# Patient Record
Sex: Female | Born: 1955
Health system: Southern US, Community
[De-identification: ages and names within clinical notes are randomized; demographics above are authoritative.]

## PROBLEM LIST (undated history)

## (undated) DIAGNOSIS — E669 Obesity, unspecified: Secondary | ICD-10-CM

## (undated) DIAGNOSIS — R739 Hyperglycemia, unspecified: Secondary | ICD-10-CM

## (undated) DIAGNOSIS — Z1501 Genetic susceptibility to malignant neoplasm of breast: Secondary | ICD-10-CM

## (undated) DIAGNOSIS — Z1509 Genetic susceptibility to other malignant neoplasm: Secondary | ICD-10-CM

## (undated) DIAGNOSIS — E079 Disorder of thyroid, unspecified: Secondary | ICD-10-CM

## (undated) DIAGNOSIS — I4891 Unspecified atrial fibrillation: Secondary | ICD-10-CM

## (undated) DIAGNOSIS — E785 Hyperlipidemia, unspecified: Secondary | ICD-10-CM

## (undated) DIAGNOSIS — R002 Palpitations: Secondary | ICD-10-CM

## (undated) HISTORY — PX: ABDOMINAL HYSTERECTOMY: SHX81

## (undated) HISTORY — PX: ROTATOR CUFF REPAIR: SHX139

## (undated) HISTORY — DX: Genetic susceptibility to malignant neoplasm of breast: Z15.01

## (undated) HISTORY — DX: Palpitations: R00.2

## (undated) HISTORY — DX: Unspecified atrial fibrillation: I48.91

## (undated) HISTORY — DX: Hyperglycemia, unspecified: R73.9

## (undated) HISTORY — PX: MASTECTOMY: SHX3

## (undated) HISTORY — DX: Genetic susceptibility to other malignant neoplasm: Z15.09

## (undated) HISTORY — DX: Obesity, unspecified: E66.9

## (undated) HISTORY — DX: Hyperlipidemia, unspecified: E78.5

## (undated) HISTORY — DX: Disorder of thyroid, unspecified: E07.9

---

## 1997-12-18 ENCOUNTER — Other Ambulatory Visit: Admission: RE | Admit: 1997-12-18 | Discharge: 1997-12-18 | Payer: Self-pay | Admitting: *Deleted

## 1999-02-09 ENCOUNTER — Other Ambulatory Visit: Admission: RE | Admit: 1999-02-09 | Discharge: 1999-02-09 | Payer: Self-pay | Admitting: *Deleted

## 2000-03-01 ENCOUNTER — Other Ambulatory Visit: Admission: RE | Admit: 2000-03-01 | Discharge: 2000-03-01 | Payer: Self-pay | Admitting: Obstetrics and Gynecology

## 2001-04-04 ENCOUNTER — Other Ambulatory Visit: Admission: RE | Admit: 2001-04-04 | Discharge: 2001-04-04 | Payer: Self-pay | Admitting: Obstetrics and Gynecology

## 2002-04-20 ENCOUNTER — Other Ambulatory Visit: Admission: RE | Admit: 2002-04-20 | Discharge: 2002-04-20 | Payer: Self-pay | Admitting: Obstetrics and Gynecology

## 2003-04-23 ENCOUNTER — Other Ambulatory Visit: Admission: RE | Admit: 2003-04-23 | Discharge: 2003-04-23 | Payer: Self-pay | Admitting: Obstetrics and Gynecology

## 2004-05-29 ENCOUNTER — Other Ambulatory Visit: Admission: RE | Admit: 2004-05-29 | Discharge: 2004-05-29 | Payer: Self-pay | Admitting: Obstetrics and Gynecology

## 2004-08-26 ENCOUNTER — Ambulatory Visit: Payer: Self-pay | Admitting: Internal Medicine

## 2005-09-07 ENCOUNTER — Other Ambulatory Visit: Admission: RE | Admit: 2005-09-07 | Discharge: 2005-09-07 | Payer: Self-pay | Admitting: Obstetrics & Gynecology

## 2006-07-29 ENCOUNTER — Ambulatory Visit: Payer: Self-pay | Admitting: Internal Medicine

## 2006-08-26 ENCOUNTER — Ambulatory Visit: Payer: Self-pay | Admitting: Internal Medicine

## 2006-09-16 ENCOUNTER — Ambulatory Visit: Payer: Self-pay | Admitting: Internal Medicine

## 2006-10-17 ENCOUNTER — Ambulatory Visit: Payer: Self-pay | Admitting: Internal Medicine

## 2006-10-17 LAB — CONVERTED CEMR LAB
ALT: 21 units/L (ref 0–40)
AST: 18 units/L (ref 0–37)
Albumin: 3.2 g/dL — ABNORMAL LOW (ref 3.5–5.2)
Alkaline Phosphatase: 57 units/L (ref 39–117)
Basophils Relative: 0.2 % (ref 0.0–1.0)
CO2: 27 meq/L (ref 19–32)
Calcium: 8.5 mg/dL (ref 8.4–10.5)
Creatinine, Ser: 0.6 mg/dL (ref 0.4–1.2)
GFR calc non Af Amer: 113 mL/min
Glucose, Bld: 107 mg/dL — ABNORMAL HIGH (ref 70–99)
HDL: 37 mg/dL — ABNORMAL LOW (ref >39.0)
Hgb A1c MFr Bld: 5.3 % (ref 4.6–6.0)
LDL Cholesterol: 102 mg/dL — ABNORMAL HIGH (ref 0–99)
Monocytes Absolute: 0.5 10*3/uL (ref 0.2–0.7)
Neutro Abs: 3.4 10*3/uL (ref 1.4–7.7)
Neutrophils Relative %: 56.8 % (ref 43.0–77.0)
Platelets: 185 10*3/uL (ref 150–400)
RBC: 4.05 M/uL (ref 3.87–5.11)
Sodium: 144 meq/L (ref 135–145)
Total Protein: 6.3 g/dL (ref 6.0–8.3)

## 2006-10-24 ENCOUNTER — Ambulatory Visit: Payer: Self-pay | Admitting: Internal Medicine

## 2006-11-24 ENCOUNTER — Ambulatory Visit: Payer: Self-pay | Admitting: Internal Medicine

## 2006-12-27 ENCOUNTER — Ambulatory Visit: Payer: Self-pay | Admitting: Internal Medicine

## 2007-03-24 ENCOUNTER — Other Ambulatory Visit: Admission: RE | Admit: 2007-03-24 | Discharge: 2007-03-24 | Payer: Self-pay | Admitting: Obstetrics and Gynecology

## 2007-03-24 DIAGNOSIS — E785 Hyperlipidemia, unspecified: Secondary | ICD-10-CM | POA: Insufficient documentation

## 2007-05-04 ENCOUNTER — Telehealth: Payer: Self-pay | Admitting: Internal Medicine

## 2007-05-09 ENCOUNTER — Ambulatory Visit: Payer: Self-pay | Admitting: Internal Medicine

## 2007-05-19 ENCOUNTER — Telehealth: Payer: Self-pay | Admitting: Internal Medicine

## 2007-05-19 LAB — CONVERTED CEMR LAB
Cholesterol: 197 mg/dL (ref 0–200)
HDL: 40.9 mg/dL (ref >39.0)
Hgb A1c MFr Bld: 5.5 % (ref 4.6–6.0)
LDL Cholesterol: 146 mg/dL — ABNORMAL HIGH (ref 0–99)

## 2007-06-02 ENCOUNTER — Ambulatory Visit: Payer: Self-pay | Admitting: Internal Medicine

## 2007-06-02 DIAGNOSIS — M25519 Pain in unspecified shoulder: Secondary | ICD-10-CM | POA: Insufficient documentation

## 2007-06-02 DIAGNOSIS — R7309 Other abnormal glucose: Secondary | ICD-10-CM | POA: Insufficient documentation

## 2007-06-13 ENCOUNTER — Telehealth: Payer: Self-pay | Admitting: *Deleted

## 2007-06-15 ENCOUNTER — Telehealth: Payer: Self-pay | Admitting: *Deleted

## 2007-07-20 ENCOUNTER — Telehealth: Payer: Self-pay | Admitting: Internal Medicine

## 2007-08-17 ENCOUNTER — Telehealth: Payer: Self-pay | Admitting: Internal Medicine

## 2007-08-22 ENCOUNTER — Telehealth: Payer: Self-pay | Admitting: Internal Medicine

## 2007-08-29 ENCOUNTER — Ambulatory Visit: Payer: Self-pay | Admitting: Internal Medicine

## 2007-09-08 ENCOUNTER — Encounter: Payer: Self-pay | Admitting: Internal Medicine

## 2007-09-20 ENCOUNTER — Ambulatory Visit: Payer: Self-pay | Admitting: Internal Medicine

## 2007-09-26 ENCOUNTER — Encounter: Payer: Self-pay | Admitting: Internal Medicine

## 2007-12-16 ENCOUNTER — Encounter: Payer: Self-pay | Admitting: Internal Medicine

## 2008-01-08 LAB — HM COLONOSCOPY

## 2008-03-11 ENCOUNTER — Encounter: Payer: Self-pay | Admitting: Internal Medicine

## 2008-03-25 ENCOUNTER — Other Ambulatory Visit: Admission: RE | Admit: 2008-03-25 | Discharge: 2008-03-25 | Payer: Self-pay | Admitting: Obstetrics and Gynecology

## 2008-03-25 HISTORY — PX: CERVICAL POLYPECTOMY: SHX88

## 2008-05-07 ENCOUNTER — Encounter: Payer: Self-pay | Admitting: Obstetrics and Gynecology

## 2008-05-07 ENCOUNTER — Ambulatory Visit (HOSPITAL_COMMUNITY): Admission: RE | Admit: 2008-05-07 | Discharge: 2008-05-08 | Payer: Self-pay | Admitting: Obstetrics and Gynecology

## 2008-05-07 ENCOUNTER — Encounter: Payer: Self-pay | Admitting: Internal Medicine

## 2008-10-19 ENCOUNTER — Encounter: Payer: Self-pay | Admitting: Internal Medicine

## 2008-10-21 ENCOUNTER — Telehealth: Payer: Self-pay | Admitting: Internal Medicine

## 2008-10-30 ENCOUNTER — Encounter: Payer: Self-pay | Admitting: *Deleted

## 2008-11-03 ENCOUNTER — Encounter: Admission: RE | Admit: 2008-11-03 | Discharge: 2008-11-03 | Payer: Self-pay | Admitting: Obstetrics and Gynecology

## 2008-11-03 ENCOUNTER — Encounter: Payer: Self-pay | Admitting: Internal Medicine

## 2009-02-25 ENCOUNTER — Ambulatory Visit: Payer: Self-pay | Admitting: Internal Medicine

## 2009-02-25 LAB — CONVERTED CEMR LAB
ALT: 16 units/L (ref 0–35)
AST: 17 units/L (ref 0–37)
CO2: 27 meq/L (ref 19–32)
Calcium: 9 mg/dL (ref 8.4–10.5)
Chloride: 108 meq/L (ref 96–112)
Cholesterol: 208 mg/dL — ABNORMAL HIGH (ref 0–200)
GFR calc non Af Amer: 93.28 mL/min (ref >60)
Glucose, Urine, Semiquant: NEGATIVE
HCT: 37.5 % (ref 36.0–46.0)
Hemoglobin: 12.6 g/dL (ref 12.0–15.0)
Lymphocytes Relative: 23.5 % (ref 12.0–46.0)
Lymphs Abs: 1.2 10*3/uL (ref 0.7–4.0)
MCHC: 33.6 g/dL (ref 30.0–36.0)
MCV: 91.4 fL (ref 78.0–100.0)
Platelets: 183 10*3/uL (ref 150.0–400.0)
RBC: 4.1 M/uL (ref 3.87–5.11)
Sodium: 139 meq/L (ref 135–145)
TSH: 0.78 microintl units/mL (ref 0.35–5.50)
Total CHOL/HDL Ratio: 5
Total Protein: 6.6 g/dL (ref 6.0–8.3)
Triglycerides: 130 mg/dL (ref 0.0–149.0)
WBC: 5.2 10*3/uL (ref 4.5–10.5)

## 2009-03-04 ENCOUNTER — Ambulatory Visit: Payer: Self-pay | Admitting: Internal Medicine

## 2009-06-10 ENCOUNTER — Telehealth: Payer: Self-pay | Admitting: *Deleted

## 2009-10-07 HISTORY — PX: BREAST SURGERY: SHX581

## 2009-10-07 LAB — HM MAMMOGRAPHY

## 2009-11-01 ENCOUNTER — Encounter: Payer: Self-pay | Admitting: Internal Medicine

## 2009-11-04 ENCOUNTER — Encounter: Payer: Self-pay | Admitting: Internal Medicine

## 2009-12-14 ENCOUNTER — Encounter: Admission: RE | Admit: 2009-12-14 | Discharge: 2009-12-14 | Payer: Self-pay | Admitting: Obstetrics and Gynecology

## 2010-01-21 ENCOUNTER — Encounter: Payer: Self-pay | Admitting: Internal Medicine

## 2010-03-05 ENCOUNTER — Encounter: Payer: Self-pay | Admitting: Internal Medicine

## 2010-03-10 ENCOUNTER — Encounter (INDEPENDENT_AMBULATORY_CARE_PROVIDER_SITE_OTHER): Payer: Self-pay | Admitting: General Surgery

## 2010-03-10 ENCOUNTER — Ambulatory Visit (HOSPITAL_COMMUNITY): Admission: RE | Admit: 2010-03-10 | Discharge: 2010-03-11 | Payer: Self-pay | Admitting: General Surgery

## 2010-03-20 ENCOUNTER — Encounter: Payer: Self-pay | Admitting: Internal Medicine

## 2010-03-23 ENCOUNTER — Telehealth: Payer: Self-pay | Admitting: *Deleted

## 2010-04-03 ENCOUNTER — Encounter: Payer: Self-pay | Admitting: Internal Medicine

## 2010-05-27 ENCOUNTER — Ambulatory Visit: Payer: Self-pay | Admitting: Internal Medicine

## 2010-05-27 LAB — CONVERTED CEMR LAB
ALT: 19 units/L (ref 0–35)
AST: 16 units/L (ref 0–37)
Basophils Absolute: 0 10*3/uL (ref 0.0–0.1)
Blood in Urine, dipstick: NEGATIVE
Cholesterol: 195 mg/dL (ref 0–200)
Eosinophils Relative: 2.7 % (ref 0.0–5.0)
Glucose, Bld: 104 mg/dL — ABNORMAL HIGH (ref 70–99)
Glucose, Urine, Semiquant: NEGATIVE
Lymphocytes Relative: 22.4 % (ref 12.0–46.0)
Lymphs Abs: 1.1 10*3/uL (ref 0.7–4.0)
MCHC: 33.4 g/dL (ref 30.0–36.0)
Monocytes Absolute: 0.6 10*3/uL (ref 0.1–1.0)
Monocytes Relative: 12.4 % — ABNORMAL HIGH (ref 3.0–12.0)
Neutrophils Relative %: 61.9 % (ref 43.0–77.0)
Nitrite: NEGATIVE
Potassium: 4.3 meq/L (ref 3.5–5.1)
RBC: 4.09 M/uL (ref 3.87–5.11)
RDW: 15.4 % — ABNORMAL HIGH (ref 11.5–14.6)
Specific Gravity, Urine: 1.01
TSH: 0.91 microintl units/mL (ref 0.35–5.50)
Total Bilirubin: 0.4 mg/dL (ref 0.3–1.2)
Total Protein: 6 g/dL (ref 6.0–8.3)
Urobilinogen, UA: 0.2
VLDL: 12.2 mg/dL (ref 0.0–40.0)
WBC Urine, dipstick: NEGATIVE
pH: 5

## 2010-06-03 ENCOUNTER — Ambulatory Visit: Payer: Self-pay | Admitting: Internal Medicine

## 2010-06-03 DIAGNOSIS — E669 Obesity, unspecified: Secondary | ICD-10-CM | POA: Insufficient documentation

## 2010-06-03 DIAGNOSIS — Z9010 Acquired absence of unspecified breast and nipple: Secondary | ICD-10-CM | POA: Insufficient documentation

## 2010-06-08 ENCOUNTER — Telehealth: Payer: Self-pay | Admitting: *Deleted

## 2010-06-08 ENCOUNTER — Encounter: Payer: Self-pay | Admitting: *Deleted

## 2010-06-25 ENCOUNTER — Ambulatory Visit: Payer: Self-pay | Admitting: Internal Medicine

## 2010-08-11 ENCOUNTER — Telehealth: Payer: Self-pay | Admitting: Internal Medicine

## 2010-09-08 NOTE — Letter (Signed)
Summary: [facility]   Imported By: Maryln Gottron 04/21/2010 12:11:33  _____________________________________________________________________  External Attachment:    Type:   Image     Comment:   External Document

## 2010-09-08 NOTE — Assessment & Plan Note (Signed)
Summary: Hep B#2/ssc=-add hep a//ccm  Nurse Visit   Allergies: No Known Drug Allergies  Immunizations Administered:  TwinRix # 1:    Vaccine Type: TwinRix    Site: right deltoid    Mfr: GlaxoSmithKline    Dose: 1.0 ml    Route: IM    Given by: Romualdo Bolk, CMA (AAMA)    Exp. Date: 02/08/2011    Lot #: [id number]  Orders Added: 1)  TwinRix 1ml ( Hep A&B Adult dose) [90636] 2)  Admin 1st Vaccine [90471]

## 2010-09-08 NOTE — Letter (Signed)
Summary: [facility]   Imported By: Maryln Gottron 02/05/2010 14:03:20  _____________________________________________________________________  External Attachment:    Type:   Image     Comment:   External Document

## 2010-09-08 NOTE — Letter (Signed)
Summary: [facility]   Imported By: Maryln Gottron 06/11/2010 08:35:32  _____________________________________________________________________  External Attachment:    Type:   Image     Comment:   External Document

## 2010-09-08 NOTE — Assessment & Plan Note (Signed)
Summary: CPX/CJR   Vital Signs:  Patient profile:   55 year old female Menstrual status:  hysterectomy Height:      66.5 inches Weight:      215 pounds BMI:     34.31 Pulse rate:   66 / minute BP sitting:   110 / 60  (left arm) Cuff size:   large  Vitals Entered By: Romualdo Bolk, CMA (AAMA) (June 03, 2010 11:44 AM)  Nutrition Counseling: Patient's BMI is greater than 25 and therefore counseled on weight management options. CC: CPX   History of Present Illness: Maria Glass comes in today  for preventive visit .  since last visit  she decided to undergo bilateral mastectomy per Dr Johna Sheriff . This was done without complication. She has no other new medical problem or illness of significance. She does want me to check her ears as they are a bit clogged. her other medical issues are related to her joints. She has a meniscal tear and has had a shoulder pain treated with a steroid injection.  Preventive Care Screening  Mammogram:    Date:  10/07/2009    Results:  normal   Prior Values:    Mammogram:  normal (10/07/2008)    Colonoscopy:  normal (01/08/2008)    Last Tetanus Booster:  Historical (05/09/2006)   Preventive Screening-Counseling & Management  Alcohol-Tobacco     Alcohol drinks/day: <1     Alcohol type: wine     Smoking Status: never  Caffeine-Diet-Exercise     Caffeine use/day: <1     Does Patient Exercise: yes     Type of exercise: walking  Hep-HIV-STD-Contraception     Dental Visit-last 6 months yes     Sun Exposure-Excessive: no  Safety-Violence-Falls     Seat Belt Use: yes     Firearms in the Home: no firearms in the home     Smoke Detectors: yes     Fall Risk: no  Current Medications (verified): 1)  Vitamin D 47829 Unit  Caps (Ergocalciferol) .Marland Kitchen.. 1 Every Other Week  Allergies (verified): No Known Drug Allergies  Past History:  Past medical, surgical, family and social histories (including risk factors) reviewed, and no changes  noted (except as noted below).  Past Medical History: Hyperlipidemia Elevated cholesterol BRCA Gene   Past Surgical History: Denies surgical history except csecx3 hysterctomy total fall 2009  Hx of hyperglycemia Mastectomy- Bilateral  Past History:  Care Management: Gynecology: [facility] Gastroenterology: [address] [facility]: [facility].   Family History: Reviewed history from 03/04/2009 and no changes required. Family History Breast cancer 1st degree relative <50 sister Family History Diabetes 1st degree relative Family History Hypertension sister to need a BM transplant  died  this past year    Social History: Reviewed history from 03/04/2009 and no changes required. widowed  October 2009 Never Smoked Regular exercise-yes Plans on bm txp donor to her sis in the spring  at [facility] Husband died  from  septicemia from  bowel i?.  Works   [employer].   Fall Risk:  no  Review of Systems  The patient denies anorexia, fever, weight loss, vision loss, chest pain, syncope, dyspnea on exertion, peripheral edema, prolonged cough, abdominal pain, melena, hematochezia, severe indigestion/heartburn, hematuria, transient blindness, difficulty walking, depression, abnormal bleeding, enlarged lymph nodes, and angioedema.         joint  Dr Ranell Patrick  torn meniscus  considering surgery.   also had bursitis in shoulolder  Physical Exam  General:  Well-developed,well-nourished,in no acute distress; alert,appropriate and cooperative throughout examination Head:  Normocephalic and atraumatic without obvious abnormalities. No apparent alopecia or balding. Eyes:  vision grossly intact, pupils equal, and pupils round.   Ears:  right ear wax left  mod wax tm grey   irrigated  and tm intact and normal  hearing better  Nose:  no external deformity, no external erythema, and no nasal discharge.   Mouth:  good dentition and pharynx pink and moist.   Neck:  No deformities, masses,  or tenderness noted. Chest Wall:  no tenderness and no mass.   Breasts:  absent breast well healing scars  Lungs:  Normal respiratory effort, chest expands symmetrically. Lungs are clear to auscultation, no crackles or wheezes. Heart:  Normal rate and regular rhythm. S1 and S2 normal without gallop, murmur, click, rub or other extra sounds.no lifts.   Abdomen:  Bowel sounds positive,abdomen soft and non-tender without masses, organomegaly or hernias noted. lap scar  well healed  Genitalia:  per gyne Msk:  no joint warmth and no redness over joints.   Pulses:  pulses intact without delay   Extremities:  no clubbing cyanosis or edema  Neurologic:  alert & oriented X3, strength normal in all extremities, gait normal, and DTRs symmetrical and normal.   Skin:  turgor normal, color normal, no ecchymoses, and no petechiae.   Cervical Nodes:  No lymphadenopathy noted Axillary Nodes:  No palpable lymphadenopathy Inguinal Nodes:  No significant adenopathy Psych:  Normal eye contact, appropriate affect. Cognition appears normal.    Impression & Recommendations:  Problem # 1:  Preventive Health Care (ICD-V70.0) Discussed nutrition,exercise,diet,healthy weight, vitamin D and calcium.   disc cross training  Problem # 2:  WAX IN EARS (ICD-380.4) flushed to day  and hearing better   Problem # 3:  HYPERLIPIDEMIA (ICD-272.4) counseled  about inprovmemt   Problem # 4:  FASTING HYPERGLYCEMIA (ICD-790.29) mild   at risk   Problem # 5:  OBESITY (ICD-278.00)  Ht: 66.5 (06/03/2010)   Wt: 215 (06/03/2010)   BMI: 34.31 (06/03/2010)  Problem # 6:  MASTECTOMY, BILATERAL, HX OF (ICD-V45.71) for BRCA gene risk  prophylactic   Complete Medication List: 1)  Vitamin D 16109 Unit Caps (Ergocalciferol) .Marland Kitchen.. 1 every other week EKG  reviewed from hospital  NSR no acute changes done March 05 2010  Patient Instructions: 1)  continued weight loss    2)  will help your blood sugar and health 3)  check labs and  check up in a year .  or as needed.    Orders Added: 1)  Est. Patient 40-64 years [99396] 2)  Est. Patient Level II [60454]

## 2010-09-08 NOTE — Miscellaneous (Signed)
Summary: Immunization Entry   Immunization History:  Hepatitis B Immunization History:    Hepatitis B # 1:  hepb adult (11/24/2006)  Hepatitis A Immunization History:    Hepatitis A # 1:  hepa (11/24/2006)

## 2010-09-08 NOTE — Progress Notes (Signed)
Summary: Checking to see if pt has had 2 or 3 Hep B inj  Phone Note Outgoing Call Call back at [facility] Phone [phone]   Call placed by: Romualdo Bolk, CMA Duncan Dull),  June 08, 2010 3:51 PM Call placed to: Patient Summary of Call: According to paper chart pt has only had 1 Hep B inj. I left a message for pt to call back to let us know if she had the 2nd Hep B somewhere else. If not, she is due for another Hep B inj. Initial call taken by: Romualdo Bolk, CMA Duncan Dull),  June 08, 2010 3:52 PM  Follow-up for Phone Call        LMTOCB Follow-up by: Romualdo Bolk, CMA Duncan Dull),  June 11, 2010 11:27 AM  Additional Follow-up for Phone Call Additional follow up Details #1::        Spoke with pt and she hasn't gotten the Hep B#2. She scheduled an appt. Additional Follow-up by: Romualdo Bolk, CMA (AAMA),  June 12, 2010 1:31 PM

## 2010-09-08 NOTE — Progress Notes (Signed)
Summary: Pt had Mammogram,MRI and Bilater Masectomy done  Phone Note Call from Patient Call back at Home Phone [phone] Call back at [phone] cell   Caller: Patient Summary of Call: Pt called and wanted to make Dr. Fabian Sharp aware that she had a Mammogram, MRI in March or April of 2011. Pt also has a Bilateral Masectomy on 03/10/10 by Dr. Johna Sheriff. Pt says that all results from all test and procedure, should have been sent to Dr. Fabian Sharp for review. Pt says that if they have not been rcvd, pls notify pt.   Initial call taken by: Lucy Antigua,  March 23, 2010 11:50 AM  Follow-up for Phone Call        Md has all info. Follow-up by: Romualdo Bolk, CMA (AAMA),  March 23, 2010 11:56 AM

## 2010-09-10 NOTE — Progress Notes (Signed)
Summary: back pain  Phone Note Call from Patient Call back at [phone]   Caller: Patient Reason for Call: Acute Illness Details for Reason: back pain Summary of Call: patient is calling because she has pulled a muscle in her back.  she would like to know if a muscle relaxer rx can be called in.  [facility]  Initial call taken by: Kern Reap CMA Duncan Dull),  August 11, 2010 10:47 AM  Follow-up for Phone Call        ok flexeril 10 mg 1 by mouth three times a day as needed disp 30  no refills Follow-up by: Madelin Headings MD,  August 11, 2010 1:23 PM  Additional Follow-up for Phone Call Additional follow up Details #1::        left message on machine for patient rx sent Additional Follow-up by: Kern Reap CMA Duncan Dull),  August 11, 2010 2:28 PM    New/Updated Medications: FLEXERIL 10 MG TABS (CYCLOBENZAPRINE HCL) take one tab three times a day as needed for pain Prescriptions: FLEXERIL 10 MG TABS (CYCLOBENZAPRINE HCL) take one tab three times a day as needed for pain  #30 x 0   Entered by:   Kern Reap CMA (AAMA)   Authorized by:   Madelin Headings MD   Signed by:   Kern Reap CMA (AAMA) on 08/11/2010   Method used:   Electronically to        [facility] # [phone]* (retail)       [address]       [place], [place]  [address]       Ph: [phone]       Fax: [phone]   RxID:   [id number]

## 2010-10-24 LAB — CBC
MCHC: 33.7 g/dL (ref 30.0–36.0)
RBC: 4.29 MIL/uL (ref 3.87–5.11)
WBC: 5.3 10*3/uL (ref 4.0–10.5)

## 2010-10-24 LAB — BASIC METABOLIC PANEL
Calcium: 9.6 mg/dL (ref 8.4–10.5)
Chloride: 108 mEq/L (ref 96–112)
Creatinine, Ser: 0.6 mg/dL (ref 0.4–1.2)
GFR calc Af Amer: >60 mL/min (ref >60)
GFR calc non Af Amer: >60 mL/min (ref >60)
Glucose, Bld: 106 mg/dL — ABNORMAL HIGH (ref 70–99)

## 2010-10-24 LAB — SURGICAL PCR SCREEN: MRSA, PCR: NEGATIVE

## 2010-12-22 NOTE — Op Note (Signed)
NAME:  Maria Glass, Maria Glass               ACCOUNT NO.:  [id number]   MEDICAL RECORD NO.:  [id number]          PATIENT TYPE:  AMB   LOCATION:  DAY                          FACILITY:  [facility]   PHYSICIAN:  Cynthia P. Romine, M.D.DATE OF BIRTH:  07-Aug-1956   DATE OF PROCEDURE:  05/07/2008  DATE OF DISCHARGE:                               OPERATIVE REPORT   PREOPERATIVE DIAGNOSIS:  BRCA 2 Gene mutation positive;  strong family  history of breast cancer.   POSTOPERATIVE DIAGNOSIS:  BRCA 2 Gene mutation positive;  strong family  history of breast cancer. Path pending.   PROCEDURE:  Robotic assisted total laparoscopic hysterectomy and  bilateral salpingo-oophorectomy.   SURGEON:  Cynthia P. Romine, M.D.   ASSISTANT:  Lum Keas, MD.   ANESTHESIA:  General endotracheal.   BLOOD LOSS:  50 mL.   COMPLICATIONS:  None.   PROCEDURE:  The patient was taken to the operating room and after  induction of adequate general endotracheal anesthesia, was positioned on  the table and taped down in the usual fashion and placed in the dorsal  lithotomy position.  She was prepped and draped in usual fashion.  A  posterior weighted and anterior Sims retractor placed.  The cervix was  grasped on its anterior lip with a single-tooth tenaculum.  The uterus  sounded to 10 cm.  The cervix was dilated to #23 Shawnie Pons.  The 10 cm tip  was placed on RUMI, a medium co-ring was used and the vaginal occluder  was used and these were placed into the uterus and vagina in the usual  fashion.  The balloon was inflated with 5 mL of fluid and the occluder  was inflated with 60 mL of air.  Attention was next turned to the  abdomen.   A small supraumbilical incision was made, carried down to the fascia  with blunt dissection.  The fascia was elevated with Kochers and opened  with Mayo.  A pursestring suture of 0 Vicryl was placed around the  fascia.  The underlying peritoneum was elevated and entered  atraumatically.   The Hasson trocar was introduced, the proper placement  was noted with the laparoscope.  The abdomen was inflated with  approximately 2 liters of CO2 using the automatic insufflator.  When the  abdomen had been inflated, the position of the accessory trocars was  marked.  There was a 8 mm trocar placed about 10 cm lateral to the  umbilicus on each side and then an 8 mm trocar on the patient's left was  placed 10 cm inferolateral to the previous one, and on the patient's  right a 10/11 trocar was placed 10 cm inferolateral to the second right-  sided trocar.  The trocars were inserted under direct visualization.   The robot was then brought in and docked.  Monopolar scissors were put  in port #1 and the gyrus bipolar cautery was put in #2.  The patient was  then placed in steep Trendelenburg, to allow the bowel to fall wall away  as much as possible.  The ureters on both sides were identified.  The  infundibulopelvic ligament was put on a mild amount of stretch and  brought away from the sidewall, cauterized with the gyrus and cut. The  round ligaments were likewise cauterized and cut, this being done on  both sides.  The anterior leaf of broad ligament was taken down sharply.  The bladder was sharply separated from the underlying cervix and co-ring  became apparent as the bladder was dissected off the cervix and vagina.  The uterine arteries on each side were identified, cauterized and cut.  The monopolar scissors were then used to incise the vaginal mucosa  around the co-ring and the uterus was delivered through the vagina with  the tubes and ovary.   The instruments were then exchanged for the Tru-Cut needle driver and 6  inch sutures of 0 Vicryl were used to close the vaginal cuff using  figure-of-eight sutures at the angle and through the length of the cuff.  Upon completion of cuff closure, the wound was irrigated and found to be  hemostatic.  The ureters were again identified and  seen to be  peristalsing.  All the pedicles were hemostatic.  Irrigation and suction  was done to clean the pelvis and the procedure was terminated.  The  instruments were removed from the ports.  The patient was undocked from  the robot and taken out of Trendelenburg.  The sleeves were removed  under direct visualization.  Pneumoperitoneum was allowed to escape and  the umbilical sleeve was removed.  The incisions were closed  subcuticularly with 4-0 Vicryl Rapide and then Dermabond.  Instruments  removed from vagina and the procedure was terminated.  The patient  tolerated it well and went in satisfactory condition to post anesthesia  recovery.      Cynthia P. Romine, M.D.  Electronically Signed     CPR/MEDQ  D:  05/07/2008  T:  05/07/2008  Job:  [id number]   cc:   Neta Mends. Fabian Sharp, MD  [address] [place], [place] [address]   Arta Bruce, RN  Heridtary Cancer Risk Assessment  [facility]

## 2011-02-18 ENCOUNTER — Telehealth: Payer: Self-pay | Admitting: Internal Medicine

## 2011-02-18 NOTE — Telephone Encounter (Signed)
Patient thinks she is due for a Hep. Series. Pt. is not sure if it was Hep A or hep B please contact pt to clarify

## 2011-02-19 NOTE — Telephone Encounter (Signed)
She is up to date on this. Pt aware of this.

## 2011-04-26 ENCOUNTER — Encounter: Payer: Self-pay | Admitting: Internal Medicine

## 2011-04-26 ENCOUNTER — Ambulatory Visit (INDEPENDENT_AMBULATORY_CARE_PROVIDER_SITE_OTHER): Payer: PRIVATE HEALTH INSURANCE | Admitting: Internal Medicine

## 2011-04-26 VITALS — BP 129/82 | HR 130 | Ht 67.0 in | Wt 216.1 lb

## 2011-04-26 DIAGNOSIS — I4891 Unspecified atrial fibrillation: Secondary | ICD-10-CM

## 2011-04-26 DIAGNOSIS — E785 Hyperlipidemia, unspecified: Secondary | ICD-10-CM

## 2011-04-26 LAB — BASIC METABOLIC PANEL
CO2: 24 mEq/L (ref 19–32)
Calcium: 9.1 mg/dL (ref 8.4–10.5)
Creatinine, Ser: 0.6 mg/dL (ref 0.4–1.2)

## 2011-04-26 MED ORDER — METOPROLOL TARTRATE 25 MG PO TABS: 25.0000 mg | ORAL_TABLET | Freq: Two times a day (BID) | ORAL | Status: DC

## 2011-04-26 NOTE — Progress Notes (Signed)
HP Patient is a 55 year old who presents from the surgical center with atrial fibrillaton The patient went in today for shoulder surgery.  Initial telemetry and that through the case showed SR. In the pACU the patient was found to be in atrial fibrillation. I was contacted   I recommended IV lopressor and she got a total of 10 mg with some slowing.   She denies dizziness.  No CP.  Does not really sense the palpitations.  Allergies not on file  Current Outpatient Prescriptions  Medication Sig Dispense Refill  . amoxicillin-clavulanate (AUGMENTIN XR) 1000-62.5 MG per tablet Take 2 tablets by mouth 2 (two) times daily.        . calcium carbonate (OS-CAL) 1250 MG chewable tablet Chew 1 tablet by mouth daily.        . fish oil-omega-3 fatty acids 1000 MG capsule Take 1 g by mouth daily.        . methocarbamol (ROBAXIN) 500 MG tablet Take 500 mg by mouth 4 (four) times daily.        . Prenatal Vit-Fe Fumarate-FA (M-VIT) tablet Take 1 tablet by mouth daily.        . Vitamin D, Ergocalciferol, (DRISDOL) 50000 UNITS CAPS Take 50,000 Units by mouth every 7 (seven) days.          No past medical history on file.  No past surgical history on file.  No family history on file.  History   Social History  . Marital Status: Widowed    Spouse Name: [name]    Number of Children: [name]  . Years of Education: [name]   Occupational History  . Not on file.   Social History Main Topics  . Smoking status: Never Smoker   . Smokeless tobacco: Never Used  . Alcohol Use: Not on file  . Drug Use: Not on file  . Sexually Active: Not on file   Other Topics Concern  . Not on file   Social History Narrative  . No narrative on file    Review of Systems:  All systems reviewed.  They are negative to the above problem except as previously stated.  Vital Signs: BP 129/82  Pulse 130  Ht 5\' 7"  (1.702 m)  Wt 216 lb 1.9 oz (98.031 kg)  BMI 33.85 kg/m2  Physical Exam  Patient is in NAD HEENT:   Normocephalic, atraumatic. EOMI, PERRLA.  Neck: JVP is normal. No thyromegaly. No bruits.  Lungs: clear to auscultation. No rales no wheezes.  Heart: Irregular rate and rhythm. Normal S1, S2. No S3.   No significant murmurs. PMI not displaced.  Abdomen:  Supple, nontender. Normal bowel sounds. No masses. No hepatomegaly.  Extremities:   Good distal pulses throughout. No lower extremity edema.  Musculoskeletal ;  L shoulder in sling. Neuro:   alert and oriented x3.  CN II-XII grossly intact.  EKG:  Atrial fibrillation.  130   Assessment and Plan:

## 2011-04-26 NOTE — Patient Instructions (Signed)
Your physician recommends that you schedule a follow-up appointment in: 2 WEEKS WITH DR ROSS  Your physician recommends that you continue on your current medications as directed. Please refer to the Current Medication list given to you today. Your physician recommends that you return for lab work in: TODAY BMET TSH  DX 427.31 Your physician has requested that you have an echocardiogram. Echocardiography is a painless test that uses sound waves to create images of your heart. It provides your doctor with information about the size and shape of your heart and how well your heart's chambers and valves are working. This procedure takes approximately one hour. There are no restrictions for this procedure. DX  427.31

## 2011-04-28 DIAGNOSIS — I4891 Unspecified atrial fibrillation: Secondary | ICD-10-CM | POA: Insufficient documentation

## 2011-04-28 HISTORY — DX: Unspecified atrial fibrillation: I48.91

## 2011-04-28 NOTE — Assessment & Plan Note (Signed)
Will need to follow. 

## 2011-04-28 NOTE — Assessment & Plan Note (Addendum)
Patient is still in atrial fibrillation.  Does not sense it.  Reported in SR preop. I would recomm treating with rate control (metoprolol) adn 325 mg ASA> I will check TSH.  I would also schedule for echo.   Disussed afib with patient and family. F/U in clinic in next 1 to 2 wks.

## 2011-04-29 ENCOUNTER — Ambulatory Visit (HOSPITAL_COMMUNITY): Payer: PRIVATE HEALTH INSURANCE | Attending: Internal Medicine

## 2011-04-29 DIAGNOSIS — I4891 Unspecified atrial fibrillation: Secondary | ICD-10-CM | POA: Insufficient documentation

## 2011-04-29 DIAGNOSIS — E669 Obesity, unspecified: Secondary | ICD-10-CM | POA: Insufficient documentation

## 2011-04-29 DIAGNOSIS — I079 Rheumatic tricuspid valve disease, unspecified: Secondary | ICD-10-CM | POA: Insufficient documentation

## 2011-04-29 DIAGNOSIS — E785 Hyperlipidemia, unspecified: Secondary | ICD-10-CM | POA: Insufficient documentation

## 2011-04-30 ENCOUNTER — Telehealth: Payer: Self-pay | Admitting: Internal Medicine

## 2011-04-30 NOTE — Telephone Encounter (Signed)
Phone  Call  On Sept 17th  from anesthesiology [facility] day surgery center that Emara had  afib with rvr after surgery for her shoulder . Disc  And he is to  contact [facility] and  Advice as planned rx .

## 2011-05-03 ENCOUNTER — Encounter: Payer: Self-pay | Admitting: Internal Medicine

## 2011-05-03 NOTE — Progress Notes (Signed)
Addended by: Burnett Kanaris A on: 05/03/2011 03:21 PM   Modules accepted: Orders

## 2011-05-04 ENCOUNTER — Ambulatory Visit: Payer: PRIVATE HEALTH INSURANCE | Attending: Orthopedic Surgery

## 2011-05-04 DIAGNOSIS — IMO0001 Reserved for inherently not codable concepts without codable children: Secondary | ICD-10-CM | POA: Insufficient documentation

## 2011-05-04 DIAGNOSIS — M25619 Stiffness of unspecified shoulder, not elsewhere classified: Secondary | ICD-10-CM | POA: Insufficient documentation

## 2011-05-04 DIAGNOSIS — M25519 Pain in unspecified shoulder: Secondary | ICD-10-CM | POA: Insufficient documentation

## 2011-05-07 ENCOUNTER — Ambulatory Visit: Payer: PRIVATE HEALTH INSURANCE | Admitting: Rehabilitation

## 2011-05-07 ENCOUNTER — Encounter: Payer: Self-pay | Admitting: Internal Medicine

## 2011-05-10 ENCOUNTER — Ambulatory Visit (INDEPENDENT_AMBULATORY_CARE_PROVIDER_SITE_OTHER): Payer: BC Managed Care – PPO | Admitting: Internal Medicine

## 2011-05-10 ENCOUNTER — Encounter: Payer: Self-pay | Admitting: Internal Medicine

## 2011-05-10 DIAGNOSIS — E785 Hyperlipidemia, unspecified: Secondary | ICD-10-CM

## 2011-05-10 DIAGNOSIS — I4891 Unspecified atrial fibrillation: Secondary | ICD-10-CM

## 2011-05-10 LAB — CBC
HCT: 32.9 — ABNORMAL LOW
HCT: 40.1
Hemoglobin: 13.1
MCHC: 32.7
MCHC: 33.3
MCV: 96.2
MCV: 96.3
Platelets: 140 — ABNORMAL LOW
Platelets: 175
RBC: 4.17
RDW: 13.9
WBC: 5.8

## 2011-05-11 ENCOUNTER — Ambulatory Visit: Payer: BC Managed Care – PPO | Attending: Orthopedic Surgery | Admitting: Rehabilitation

## 2011-05-11 DIAGNOSIS — M25619 Stiffness of unspecified shoulder, not elsewhere classified: Secondary | ICD-10-CM | POA: Insufficient documentation

## 2011-05-11 DIAGNOSIS — M25519 Pain in unspecified shoulder: Secondary | ICD-10-CM | POA: Insufficient documentation

## 2011-05-11 DIAGNOSIS — IMO0001 Reserved for inherently not codable concepts without codable children: Secondary | ICD-10-CM | POA: Insufficient documentation

## 2011-05-11 NOTE — Progress Notes (Signed)
HPI  Patient is a 55 year old who I saw in clinic on 9/17 for new atrial fibrillation.  It occurred in the perop period of shoulder surgery. I placed her on a low dose b blocker and ASA Since seen she had a TSH checked that was normal.  Echo was also done that was normal. Since seen she denies palpitaitons  Breathing is OK.  Shoulder is feeling some better. Not on File  Current Outpatient Prescriptions  Medication Sig Dispense Refill  . calcium carbonate (OS-CAL) 1250 MG chewable tablet Chew 1 tablet by mouth daily.        . fish oil-omega-3 fatty acids 1000 MG capsule Take 1 g by mouth daily.        . methocarbamol (ROBAXIN) 500 MG tablet Take 500 mg by mouth 4 (four) times daily.        . metoprolol tartrate (LOPRESSOR) 25 MG tablet Take 1 tablet (25 mg total) by mouth 2 (two) times daily.  60 tablet  11  . oxyCODONE-acetaminophen (PERCOCET) 5-325 MG per tablet Take 1 tablet by mouth every 4 (four) hours as needed.        . Prenatal Vit-Fe Fumarate-FA (M-VIT) tablet Take 1 tablet by mouth daily.        . Vitamin D, Ergocalciferol, (DRISDOL) 50000 UNITS CAPS Take 50,000 Units by mouth every 7 (seven) days.          Past Medical History  Diagnosis Date  . Hyperlipidemia   . H/O: hysterectomy   . Hyperglycemia     Past Surgical History  Procedure Date  . Cesarean section     x 3  . Mastectomy     bilateral    Family History  Problem Relation Age of Onset  . Breast cancer Sister   . Diabetes Other     first degree relative   . Hypertension      family hx of    History   Social History  . Marital Status: Widowed    Spouse Name: [name]    Number of Children: [name]  . Years of Education: [name]   Occupational History  . works at [employer]    Social History Main Topics  . Smoking status: Never Smoker   . Smokeless tobacco: Never Used  . Alcohol Use: No  . Drug Use: No  . Sexually Active: Not on file   Other Topics Concern  . Not on file   Social History  Narrative  . No narrative on file    Review of Systems:  All systems reviewed.  They are negative to the above problem except as previously stated.  Vital Signs: BP 125/81  Pulse 61  Wt 212 lb (96.163 kg)  Physical Exam  patinet is in NAD HEENT:  Normocephalic, atraumatic. EOMI, PERRLA.  Neck: JVP is normal. No thyromegaly. No bruits.  Lungs: clear to auscultation. No rales no wheezes.  Heart: Regular rate and rhythm. Normal S1, S2. No S3.   No significant murmurs. PMI not displaced.  Abdomen:  Supple, nontender. Normal bowel sounds. No masses. No hepatomegaly.  Extremities:   Good distal pulses throughout. No lower extremity edema.  Musculoskeletal :moving all extremities.  Neuro:   alert and oriented x3.  CN II-XII grossly intact.   Assessment and Plan:

## 2011-05-11 NOTE — Assessment & Plan Note (Signed)
Echo was normal.  Note that she was in SR during the study.  Today on exam she is in SR though I did not do an ekg I think that the spell she had was most likely related to the surgery with meds, increased catecholamines. I think that she can taper and d/c metoprolol I would recomm continuing 325 asa for now.  She can f/u with Dr. Fabian Sharp.  If in several months she is still in SR she can probably d/c. I have not set a definite f/u unless she has a recurrence.

## 2011-05-11 NOTE — Assessment & Plan Note (Signed)
F/u with Dr. Fabian Sharp.

## 2011-05-12 ENCOUNTER — Encounter: Payer: Self-pay | Admitting: Internal Medicine

## 2011-05-12 ENCOUNTER — Ambulatory Visit: Payer: BC Managed Care – PPO | Admitting: Rehabilitation

## 2011-05-17 ENCOUNTER — Ambulatory Visit: Payer: BC Managed Care – PPO | Admitting: Rehabilitation

## 2011-05-20 ENCOUNTER — Encounter: Payer: PRIVATE HEALTH INSURANCE | Admitting: Rehabilitation

## 2011-07-24 ENCOUNTER — Ambulatory Visit (INDEPENDENT_AMBULATORY_CARE_PROVIDER_SITE_OTHER): Payer: BC Managed Care – PPO | Admitting: Family Medicine

## 2011-07-24 ENCOUNTER — Encounter: Payer: Self-pay | Admitting: Family Medicine

## 2011-07-24 VITALS — BP 122/80 | HR 72 | Temp 98.2°F | Ht 67.0 in | Wt 213.0 lb

## 2011-07-24 DIAGNOSIS — J40 Bronchitis, not specified as acute or chronic: Secondary | ICD-10-CM

## 2011-07-24 MED ORDER — AZITHROMYCIN 250 MG PO TABS: 250.0000 mg | ORAL_TABLET | Freq: Every day | ORAL | Status: AC

## 2011-07-24 MED ORDER — BENZONATATE 200 MG PO CAPS: 200.0000 mg | ORAL_CAPSULE | Freq: Three times a day (TID) | ORAL | Status: AC | PRN

## 2011-07-24 NOTE — Progress Notes (Signed)
  Subjective:    Patient ID: Maria Glass, female    DOB: Aug 31, 1955, 55 y.o.   MRN: [id number]  HPI Cough- 'really bad last night'.  Works in Radio broadcast assistant.  Productive of clear sputum.  sxs started 10 days ago.  Started Zycam.  Monday developed nasal congestion.  No fevers.  Denies facial pain/pressure.  No sore throat.  + R ear pain.  + sick contacts   Review of Systems For ROS see HPI     Objective:   Physical Exam  Vitals reviewed. Constitutional: She appears well-developed and well-nourished. No distress.  HENT:  Head: Normocephalic and atraumatic.       TMs normal bilaterally Mild nasal congestion Throat w/out erythema, edema, or exudate  Eyes: Conjunctivae and EOM are normal. Pupils are equal, round, and reactive to light.  Neck: Normal range of motion. Neck supple.  Cardiovascular: Normal rate, regular rhythm, normal heart sounds and intact distal pulses.   No murmur heard. Pulmonary/Chest: Effort normal and breath sounds normal. No respiratory distress. She has no wheezes.       + hacking cough  Lymphadenopathy:    She has no cervical adenopathy.          Assessment & Plan:

## 2011-07-24 NOTE — Patient Instructions (Signed)
This is a bronchitis Start the Zpack as directed Drink plenty of fluids Continue the tessalon- add Delsym or robitussin as needed REST! Hang in there! Happy Holidays!

## 2011-07-24 NOTE — Assessment & Plan Note (Signed)
Given duration of cough and recent worsening will start abx.  Cough meds prn.  Reviewed supportive care and red flags that should prompt return.  Pt expressed understanding and is in agreement w/ plan.

## 2011-09-10 ENCOUNTER — Telehealth: Payer: Self-pay | Admitting: *Deleted

## 2011-09-10 NOTE — Telephone Encounter (Signed)
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 She states that she is having a scratching throat and coughing. Advised pt to [facility] urgent care.

## 2011-09-10 NOTE — Telephone Encounter (Signed)
Patient is calling for a refill of Z-pak.  [facility] and was diagnosed with bronchitis.  Her symptoms have returned and she is unable to [facility].  She would like to know if she can have a refill or if she should schedule an appointment for Monday?

## 2011-10-04 ENCOUNTER — Telehealth: Payer: Self-pay | Admitting: Internal Medicine

## 2011-10-04 MED ORDER — SCOPOLAMINE 1 MG/3DAYS TD PT72: 1.0000 | MEDICATED_PATCH | TRANSDERMAL | Status: AC

## 2011-10-04 NOTE — Telephone Encounter (Signed)
Rx sent to [facility]

## 2011-10-04 NOTE — Telephone Encounter (Signed)
Pt is going on a cruise in  2 wks and pt is req a motion sickness patch to be called in to [facility] at [place] [phone]

## 2011-11-16 ENCOUNTER — Other Ambulatory Visit (INDEPENDENT_AMBULATORY_CARE_PROVIDER_SITE_OTHER): Payer: BC Managed Care – PPO

## 2011-11-16 DIAGNOSIS — Z0000 Encounter for general adult medical examination without abnormal findings: Secondary | ICD-10-CM

## 2011-11-16 LAB — CBC WITH DIFFERENTIAL/PLATELET
Basophils Relative: 0.6 % (ref 0.0–3.0)
Eosinophils Relative: 2.2 % (ref 0.0–5.0)
HCT: 37.7 % (ref 36.0–46.0)
Hemoglobin: 12.6 g/dL (ref 12.0–15.0)
MCHC: 33.3 g/dL (ref 30.0–36.0)
MCV: 91.8 fl (ref 78.0–100.0)
Monocytes Absolute: 0.4 10*3/uL (ref 0.1–1.0)
Neutro Abs: 2.9 10*3/uL (ref 1.4–7.7)
Neutrophils Relative %: 62.6 % (ref 43.0–77.0)
RBC: 4.11 Mil/uL (ref 3.87–5.11)
WBC: 4.7 10*3/uL (ref 4.5–10.5)

## 2011-11-16 LAB — POCT URINALYSIS DIPSTICK
Bilirubin, UA: NEGATIVE
Glucose, UA: NEGATIVE
Ketones, UA: NEGATIVE
Leukocytes, UA: NEGATIVE
Nitrite, UA: NEGATIVE

## 2011-11-16 LAB — HEPATIC FUNCTION PANEL
ALT: 19 U/L (ref 0–35)
Albumin: 3.7 g/dL (ref 3.5–5.2)
Alkaline Phosphatase: 88 U/L (ref 39–117)
Bilirubin, Direct: 0 mg/dL (ref 0.0–0.3)
Total Protein: 6.7 g/dL (ref 6.0–8.3)

## 2011-11-16 LAB — BASIC METABOLIC PANEL
CO2: 27 mEq/L (ref 19–32)
Creatinine, Ser: 0.7 mg/dL (ref 0.4–1.2)
Potassium: 4.5 mEq/L (ref 3.5–5.1)

## 2011-11-16 LAB — TSH: TSH: 0.8 u[IU]/mL (ref 0.35–5.50)

## 2011-11-16 LAB — LIPID PANEL: Triglycerides: 44 mg/dL (ref 0.0–149.0)

## 2011-11-23 ENCOUNTER — Encounter: Payer: Self-pay | Admitting: Internal Medicine

## 2011-11-23 ENCOUNTER — Ambulatory Visit (INDEPENDENT_AMBULATORY_CARE_PROVIDER_SITE_OTHER): Payer: BC Managed Care – PPO | Admitting: Internal Medicine

## 2011-11-23 VITALS — BP 108/78 | HR 79 | Temp 98.2°F | Ht 67.0 in | Wt 216.0 lb

## 2011-11-23 DIAGNOSIS — Z90710 Acquired absence of both cervix and uterus: Secondary | ICD-10-CM

## 2011-11-23 DIAGNOSIS — R7309 Other abnormal glucose: Secondary | ICD-10-CM

## 2011-11-23 DIAGNOSIS — Z1501 Genetic susceptibility to malignant neoplasm of breast: Secondary | ICD-10-CM

## 2011-11-23 DIAGNOSIS — I4891 Unspecified atrial fibrillation: Secondary | ICD-10-CM

## 2011-11-23 DIAGNOSIS — L259 Unspecified contact dermatitis, unspecified cause: Secondary | ICD-10-CM

## 2011-11-23 DIAGNOSIS — Z9010 Acquired absence of unspecified breast and nipple: Secondary | ICD-10-CM

## 2011-11-23 DIAGNOSIS — E669 Obesity, unspecified: Secondary | ICD-10-CM

## 2011-11-23 DIAGNOSIS — Z15068 Genetic susceptibility to other malignant neoplasm of digestive system: Secondary | ICD-10-CM

## 2011-11-23 DIAGNOSIS — Z0000 Encounter for general adult medical examination without abnormal findings: Secondary | ICD-10-CM

## 2011-11-23 DIAGNOSIS — E785 Hyperlipidemia, unspecified: Secondary | ICD-10-CM

## 2011-11-23 DIAGNOSIS — L309 Dermatitis, unspecified: Secondary | ICD-10-CM

## 2011-11-23 DIAGNOSIS — R739 Hyperglycemia, unspecified: Secondary | ICD-10-CM

## 2011-11-23 MED ORDER — TRIAMCINOLONE ACETONIDE 0.1 % EX OINT: TOPICAL_OINTMENT | Freq: Two times a day (BID) | CUTANEOUS | Status: DC

## 2011-11-23 NOTE — Progress Notes (Signed)
Subjective:    Patient ID: Maria Glass, female    DOB: 1956/04/04, 56 y.o.   MRN: [id number]  HPI Patient comes in today for Preventive Health Care visit  Since last visit she had her shoulder surgery but had AF tha required eval by cards felt to be from meds and proceedural  Context . See dr Tenny Craw notes. On ASA  Told to check about  Stopping if no recurrence . Otherwise no change  in health Gets hand eczema asks for refill or steroid ointment to help hers is expired.    Review of Systems ROS: Mild tear right knee hx of injections and doing well  GEN/ HEENT: No fever, significant weight changes sweats headaches vision problems hearing changes, CV/ PULM; No chest pain shortness of breath cough, syncope,edema  change in exercise tolerance. GI /GU: No adominal pain, vomiting, change in bowel habits. No blood in the stool. No significant GU symptoms. SKIN/HEME: ,no acute skin rashes suspicious lesions or bleeding. No lymphadenopathy, nodules, masses.  NEURO/ PSYCH:  No neurologic signs such as weakness numbness. No depression anxiety. IMM/ Allergy: No unusual infections.  Allergy .   REST of 12 system review negative except as per HPI  Past history family history social history reviewed in the electronic medical record. Outpatient Prescriptions Prior to Visit  Medication Sig Dispense Refill  . calcium carbonate (OS-CAL) 1250 MG chewable tablet Chew 1 tablet by mouth daily.        . fish oil-omega-3 fatty acids 1000 MG capsule Take 1 g by mouth daily.        Marland Kitchen scopolamine (TRANSDERM-SCOP) 1.5 MG Place 1 patch (1.5 mg total) onto the skin every 3 (three) days.  5 patch  0  . Vitamin D, Ergocalciferol, (DRISDOL) 50000 UNITS CAPS Take 50,000 Units by mouth every 7 (seven) days.              Objective:   Physical Exam BP 108/78  Pulse 79  Temp(Src) 98.2 F (36.8 C) (Oral)  Ht 5\' 7"  (1.702 m)  Wt 216 lb (97.977 kg)  BMI 33.83 kg/m2  SpO2 98%  Physical Exam: Vital signs  reviewed WUJ:WJXB is a well-developed well-nourished alert cooperative  white female who appears her stated age in no acute distress.  HEENT: normocephalic atraumatic , Eyes: PERRL EOM's full, conjunctiva clear, Nares: paten,t no deformity discharge or tenderness., Ears: no deformity EAC's clear TMs with normal landmarks. Mouth: clear OP, no lesions, edema.  Moist mucous membranes. Dentition in adequate repair. NECK: supple without masses, thyromegaly or bruits. CHEST/PULM:  Clear to auscultation and percussion breath sounds equal no wheeze , rales or rhonchi. No chest wall deformities or tenderness. Breast absent and no axillary adenopathy  CV: PMI is nondisplaced, S1 S2 no gallops, murmurs, rubs. Peripheral pulses are full without delay.No JVD .  ABDOMEN: Bowel sounds normal nontender  No guard or rebound, no hepato splenomegal no CVA tenderness.  No hernia. Extremtities:  No clubbing cyanosis or edema, no acute joint swelling or redness no focal atrophy NEURO:  Oriented x3, cranial nerves 3-12 appear to be intact, no obvious focal weakness,gait within normal limits no abnormal reflexes or asymmetrical SKIN: No acute rashes normal turgor, color, no bruising or petechiae. PSYCH: Oriented, good eye contact, no obvious depression anxiety, cognition and judgment appear normal. LN: no cervical axillary inguinal adenopathy    Lab Results  Component Value Date   WBC 4.7 11/16/2011   HGB 12.6 11/16/2011   HCT 37.7 11/16/2011  PLT 159.0 11/16/2011   GLUCOSE 99 11/16/2011   CHOL 208* 11/16/2011   TRIG 44.0 11/16/2011   HDL 56.30 11/16/2011   LDLDIRECT 148.9 11/16/2011   LDLCALC 138* 05/27/2010   ALT 19 11/16/2011   AST 16 11/16/2011   NA 142 11/16/2011   K 4.5 11/16/2011   CL 106 11/16/2011   CREATININE 0.7 11/16/2011   BUN 19 11/16/2011   CO2 27 11/16/2011   TSH 0.80 11/16/2011   HGBA1C 5.5 05/09/2007          Assessment & Plan:   Preventive Health Care Counseled regarding healthy nutrition, exercise, sleep,  injury prevention, calcium vit d and healthy weight . She is utd but  Is not enteredin the ehr yet.   Hx of AF post op wifelt from med  And no recurrence clinically for over 6 months    Elevated lipids modestly disc lsi    Obesity weight loss will helap Hand eczema refill [facility] ointment BRCA gene positive with hysterectomy and mastectomy

## 2011-11-23 NOTE — Patient Instructions (Signed)
Healthy weight loss would  be helpful for your cholesterol  Try "[email or web address].[email or web address]" to track intake and energy  Output.  Since you appear to have had no recurrence of the AF since the surgery it is reasonable to stop the asa . Hwever if any irreg or racing heart sx then need immediate recheck.

## 2012-02-28 ENCOUNTER — Telehealth: Payer: Self-pay | Admitting: Internal Medicine

## 2012-02-28 NOTE — Telephone Encounter (Signed)
naproxyn 500 1 po bid for 1-2 weeks or prn.  Disp 40 no refills  Fu if  Not getting better or controlled

## 2012-02-28 NOTE — Telephone Encounter (Signed)
Left message on voicemail for the pt to return my call. 

## 2012-02-28 NOTE — Telephone Encounter (Signed)
Caller: Maria Glass/Patient; PCP: Madelin Headings.; CB#: [phone]; ; ; Call regarding Neck Pain;  Onset- 02/26/12  Afebrile. Pt c/o of pain that she rates at a 5 in her right shoulder and neck. Pt did have ligament surgery in this shoulder in September 2012. States she does hear a crackling noise. Emergent s/s of Shoulder non-injury protocol r/o. Pt to see provider within 24 hrs. Pt wants to know if physician will call in an antiiflammatory for her without an appt. She is using OTC Aleve without relief. Pharmacy is [facility].

## 2012-02-29 ENCOUNTER — Other Ambulatory Visit: Payer: Self-pay | Admitting: Family Medicine

## 2012-02-29 MED ORDER — NAPROXEN 500 MG PO TABS: 500.0000 mg | ORAL_TABLET | Freq: Two times a day (BID) | ORAL | Status: AC

## 2012-02-29 NOTE — Telephone Encounter (Signed)
The pt was notified by telephone.  Rx sent to the pharmacy.

## 2012-04-10 LAB — HM PAP SMEAR: HM Pap smear: NORMAL

## 2012-05-16 ENCOUNTER — Telehealth: Payer: Self-pay | Admitting: Internal Medicine

## 2012-05-16 NOTE — Telephone Encounter (Signed)
Caller: [name]/Patient; Patient Name: Maria Glass; PCP: Berniece Andreas [facility]); Best Callback Phone Number: [phone] Pt wants to know if she had Vit D drawn in the spring at her visit. Pt informed per EPIC no Vit D seen for this year. Pt states her Gyn usually checks it so she will get her to check it and if it is a problem with that she will call back.

## 2012-08-19 ENCOUNTER — Ambulatory Visit (INDEPENDENT_AMBULATORY_CARE_PROVIDER_SITE_OTHER): Payer: No Typology Code available for payment source | Admitting: Internal Medicine

## 2012-08-19 ENCOUNTER — Encounter: Payer: Self-pay | Admitting: Internal Medicine

## 2012-08-19 VITALS — BP 130/70 | HR 71 | Temp 97.7°F | Wt 220.0 lb

## 2012-08-19 DIAGNOSIS — J069 Acute upper respiratory infection, unspecified: Secondary | ICD-10-CM

## 2012-08-19 NOTE — Patient Instructions (Addendum)
URI - no evidence of a bacterial infection.  Plan No antibiotics indicated  Mucinex 1200 mg twice a day; DM is ok, D not so much  Cough syrup of choice  Supportive care: chicken soup, hydration, vitamin C, Rest, Vaporizer if needed.   Upper Respiratory Infection, Adult An upper respiratory infection (URI) is also sometimes known as the common cold. The upper respiratory tract includes the nose, sinuses, throat, trachea, and bronchi. Bronchi are the airways leading to the lungs. Most people improve within 1 week, but symptoms can last up to 2 weeks. A residual cough may last even longer.   CAUSES Many different viruses can infect the tissues lining the upper respiratory tract. The tissues become irritated and inflamed and often become very moist. Mucus production is also common. A cold is contagious. You can easily spread the virus to others by oral contact. This includes kissing, sharing a glass, coughing, or sneezing. Touching your mouth or nose and then touching a surface, which is then touched by another person, can also spread the virus. SYMPTOMS   Symptoms typically develop 1 to 3 days after you come in contact with a cold virus. Symptoms vary from person to person. They may include:  Runny nose.   Sneezing.   Nasal congestion.   Sinus irritation.   Sore throat.   Loss of voice (laryngitis).   Cough.   Fatigue.   Muscle aches.   Loss of appetite.   Headache.   Low-grade fever.  DIAGNOSIS   You might diagnose your own cold based on familiar symptoms, since most people get a cold 2 to 3 times a year. Your caregiver can confirm this based on your exam. Most importantly, your caregiver can check that your symptoms are not due to another disease such as strep throat, sinusitis, pneumonia, asthma, or epiglottitis. Blood tests, throat tests, and X-rays are not necessary to diagnose a common cold, but they may sometimes be helpful in excluding other more serious diseases. Your  caregiver will decide if any further tests are required. RISKS AND COMPLICATIONS   You may be at risk for a more severe case of the common cold if you smoke cigarettes, have chronic heart disease (such as heart failure) or lung disease (such as asthma), or if you have a weakened immune system. The very young and very old are also at risk for more serious infections. Bacterial sinusitis, middle ear infections, and bacterial pneumonia can complicate the common cold. The common cold can worsen asthma and chronic obstructive pulmonary disease (COPD). Sometimes, these complications can require emergency medical care and may be life-threatening. PREVENTION   The best way to protect against getting a cold is to practice good hygiene. Avoid oral or hand contact with people with cold symptoms. Wash your hands often if contact occurs. There is no clear evidence that vitamin C, vitamin E, echinacea, or exercise reduces the chance of developing a cold. However, it is always recommended to get plenty of rest and practice good nutrition. TREATMENT   Treatment is directed at relieving symptoms. There is no cure. Antibiotics are not effective, because the infection is caused by a virus, not by bacteria. Treatment may include:  Increased fluid intake. Sports drinks offer valuable electrolytes, sugars, and fluids.   Breathing heated mist or steam (vaporizer or shower).   Eating chicken soup or other clear broths, and maintaining good nutrition.   Getting plenty of rest.   Using gargles or lozenges for comfort.   Controlling fevers with  ibuprofen or acetaminophen as directed by your caregiver.   Increasing usage of your inhaler if you have asthma.  Zinc gel and zinc lozenges, taken in the first 24 hours of the common cold, can shorten the duration and lessen the severity of symptoms. Pain medicines may help with fever, muscle aches, and throat pain. A variety of non-prescription medicines are available to treat  congestion and runny nose. Your caregiver can make recommendations and may suggest nasal or lung inhalers for other symptoms.   HOME CARE INSTRUCTIONS    Only take over-the-counter or prescription medicines for pain, discomfort, or fever as directed by your caregiver.   Use a warm mist humidifier or inhale steam from a shower to increase air moisture. This may keep secretions moist and make it easier to breathe.   Drink enough water and fluids to keep your urine clear or pale yellow.   Rest as needed.   Return to work when your temperature has returned to normal or as your caregiver advises. You may need to stay home longer to avoid infecting others. You can also use a face mask and careful hand washing to prevent spread of the virus.  SEEK MEDICAL CARE IF:    After the first few days, you feel you are getting worse rather than better.   You need your caregiver's advice about medicines to control symptoms.   You develop chills, worsening shortness of breath, or brown or red sputum. These may be signs of pneumonia.   You develop yellow or brown nasal discharge or pain in the face, especially when you bend forward. These may be signs of sinusitis.   You develop a fever, swollen neck glands, pain with swallowing, or white areas in the back of your throat. These may be signs of strep throat.  SEEK IMMEDIATE MEDICAL CARE IF:    You have a fever.   You develop severe or persistent headache, ear pain, sinus pain, or chest pain.   You develop wheezing, a prolonged cough, cough up blood, or have a change in your usual mucus (if you have chronic lung disease).   You develop sore muscles or a stiff neck.  Document Released: 01/19/2001 Document Revised: 10/18/2011 Document Reviewed: 11/27/2010 [facility] Patient Information 2013 [place], [place].

## 2012-08-19 NOTE — Progress Notes (Signed)
Subjective:    Patient ID: Maria Glass, female    DOB: 07-21-1956, 57 y.o.   MRN: [id number]  HPI Ms. [name] presents with a 3 day h/o cough and hoaseness. No SOB. No fever. Cough is productive of thick, colored mucus that does not have a bitter or metallic taste. No sinus pain or pressure. No N/V.   Past Medical History  Diagnosis Date  . Hyperlipidemia   . H/O: hysterectomy   . Hyperglycemia   . BRCA gene positive   . Atrial fibrillation 04/28/2011   Past Surgical History  Procedure Date  . Cesarean section     x 3  . Mastectomy     bilateral  . Abdominal hysterectomy fall 2009   Family History  Problem Relation Age of Onset  . Breast cancer Sister   . Diabetes Other     first degree relative   . Hypertension      family hx of   History   Social History  . Marital Status: Widowed    Spouse Name: [name]    Number of Children: [name]  . Years of Education: [name]   Occupational History  . works at [employer]    Social History Main Topics  . Smoking status: Never Smoker   . Smokeless tobacco: Never Used  . Alcohol Use: No  . Drug Use: No  . Sexually Active: Not on file   Other Topics Concern  . Not on file   Social History Narrative   Widowed October 2009Regular Exercise-yesPlans on bm txp donor to her sis in the spring at [facility] died from septicemia from bowel?Works at [employer] now sold her house and living with her son.  They buy a condo is trying to eat healthierSleep 6-7 hours     Current Outpatient Prescriptions on File Prior to Visit  Medication Sig Dispense Refill  . calcium carbonate (OS-CAL) 1250 MG chewable tablet Chew 1 tablet by mouth daily.        . fish oil-omega-3 fatty acids 1000 MG capsule Take 1 g by mouth daily.        . naproxen (NAPROSYN) 500 MG tablet Take 1 tablet (500 mg total) by mouth 2 (two) times daily with a meal.  40 tablet  0  . Vitamin D, Ergocalciferol, (DRISDOL) 50000 UNITS CAPS Take 50,000  Units by mouth every 7 (seven) days.        Marland Kitchen scopolamine (TRANSDERM-SCOP) 1.5 MG Place 1 patch (1.5 mg total) onto the skin every 3 (three) days.  5 patch  0  . triamcinolone ointment (KENALOG) 0.1 % Apply topically 2 (two) times daily. For hand eczema  30 g  1      Review of Systems System review is negative for any constitutional, cardiac, pulmonary, GI or neuro symptoms or complaints other than as described in the HPI.     Objective:   Physical Exam Filed Vitals:   08/19/12 1203  BP: 130/70  Pulse: 71  Temp: 97.7 F (36.5 C)   Gen'l- WNWD white woman in no distress HEENT - TMs normal, throat clear Neck - supple Nodes - negative cervical and supraclavicular region Cor - RRR Pulm - CTAP       Assessment & Plan:  URI - no evidence of a bacterial infection.  Plan No antibiotics indicated  Mucinex 1200 mg twice a day; DM is ok, D not so much  Cough syrup of choice  Supportive care: chicken soup,  hydration, vitamin C, Rest, Vaporizer if needed.

## 2012-08-23 ENCOUNTER — Telehealth: Payer: Self-pay | Admitting: Internal Medicine

## 2012-08-23 NOTE — Telephone Encounter (Signed)
[facility] Triage Call Report Triage Record Num: [id number] Operator: Kelle Darting Patient Name: Maria Glass Call Date & Time: 08/22/2012 5:03:52PM Patient Phone: [phone] PCP: Neta Mends. Panosh Patient Gender: Female PCP Fax : [phone] Patient DOB: 07/29/1956 [facility] Name: [facility] Reason for Call: Caller: [name]/Patient; PCP: Berniece Andreas (Family Practice); CB#: [phone]; Call regarding Cough/Congestion; Afebrile; Onset: 08/14/12; Sx notes: Has had this cough, [facility], diagnosed with an URI, just needing something more for her cough, worse at night, Delsym is not working at night, is fine during daytime, coughing up a yellow sputum now, is taking Mucinex with help; Guideline used: Cough; Disposition: See provider within 24 hours due to productive cough with colored sputum; Appt. made: No, patient wants to wait and see how it goes, will call back for an appt. on Thursday if needed. Protocol(s) Used: Cough - Adult Recommended Outcome per Protocol: See Provider within 24 hours Reason for Outcome: Productive cough with colored sputum (other than clear or white sputum) Care Advice: ~ Use a cool mist humidifier to moisten air. Be sure to clean according to manufacturer's instructions. Limit or avoid exposure to irritants and allergens (e.g. air pollution, smoke/smoking, chemicals, dust, pollen, pet dander, etc.) ~ Call provider if fever greater than 101.5 F (38.6 C) or 100.5 F (38.1C) in an immunocompromised patient (such as diabetes, HIV/AIDS, renal disease, chemotherapy, organ transplant, or chronic steroid use) has not improved in 24 hours. ~ Increase fluids to 8-12 eight oz (1.6 to 2.4 liters) glasses per day, half of them to be water. Soups, popsicles, fruit juices, non-caffeinated sodas (unless restricting sodium intake), jello, broths, decaf teas, etc. are all okay. Warm fluids can be soothing. ~ Coughing up mucus or phlegm  helps to get rid of an infection. A productive cough should not be stopped. A cough medicine with guaifenesin (Robitussin, Mucinex) can help loosen the mucus. Cough medicine with dextromethorphan (DM) should be avoided. Drinking lots of fluids can help loosen the mucus too, especially warm fluids. ~ 01/14/

## 2012-12-07 DIAGNOSIS — E079 Disorder of thyroid, unspecified: Secondary | ICD-10-CM

## 2012-12-07 HISTORY — DX: Disorder of thyroid, unspecified: E07.9

## 2012-12-11 ENCOUNTER — Other Ambulatory Visit (INDEPENDENT_AMBULATORY_CARE_PROVIDER_SITE_OTHER): Payer: No Typology Code available for payment source

## 2012-12-11 DIAGNOSIS — Z0000 Encounter for general adult medical examination without abnormal findings: Secondary | ICD-10-CM

## 2012-12-11 LAB — POCT URINALYSIS DIPSTICK
Blood, UA: NEGATIVE
Glucose, UA: NEGATIVE
Nitrite, UA: NEGATIVE
Urobilinogen, UA: 0.2
pH, UA: 5

## 2012-12-11 LAB — CBC WITH DIFFERENTIAL/PLATELET
Basophils Absolute: 0 10*3/uL (ref 0.0–0.1)
HCT: 38 % (ref 36.0–46.0)
Hemoglobin: 12.9 g/dL (ref 12.0–15.0)
Lymphs Abs: 1.4 10*3/uL (ref 0.7–4.0)
MCHC: 34.1 g/dL (ref 30.0–36.0)
MCV: 91.3 fl (ref 78.0–100.0)
Monocytes Relative: 8.9 % (ref 3.0–12.0)
Neutro Abs: 2.7 10*3/uL (ref 1.4–7.7)
RDW: 13.4 % (ref 11.5–14.6)

## 2012-12-11 LAB — BASIC METABOLIC PANEL
CO2: 25 mEq/L (ref 19–32)
Chloride: 107 mEq/L (ref 96–112)
GFR: 86.25 mL/min (ref >60.00)
Glucose, Bld: 105 mg/dL — ABNORMAL HIGH (ref 70–99)
Potassium: 4.7 mEq/L (ref 3.5–5.1)
Sodium: 140 mEq/L (ref 135–145)

## 2012-12-11 LAB — TSH: TSH: 0.71 u[IU]/mL (ref 0.35–5.50)

## 2012-12-11 LAB — HEPATIC FUNCTION PANEL
ALT: 18 U/L (ref 0–35)
AST: 18 U/L (ref 0–37)
Albumin: 3.8 g/dL (ref 3.5–5.2)

## 2012-12-12 LAB — LDL CHOLESTEROL, DIRECT: Direct LDL: 139.5 mg/dL

## 2012-12-12 LAB — VITAMIN D 25 HYDROXY (VIT D DEFICIENCY, FRACTURES): Vit D, 25-Hydroxy: 39 ng/mL (ref 30–89)

## 2012-12-18 ENCOUNTER — Ambulatory Visit (INDEPENDENT_AMBULATORY_CARE_PROVIDER_SITE_OTHER): Payer: No Typology Code available for payment source | Admitting: Internal Medicine

## 2012-12-18 ENCOUNTER — Encounter: Payer: Self-pay | Admitting: Internal Medicine

## 2012-12-18 VITALS — BP 100/60 | HR 68 | Temp 98.2°F | Ht 67.0 in | Wt 214.0 lb

## 2012-12-18 DIAGNOSIS — E559 Vitamin D deficiency, unspecified: Secondary | ICD-10-CM

## 2012-12-18 DIAGNOSIS — R9389 Abnormal findings on diagnostic imaging of other specified body structures: Secondary | ICD-10-CM

## 2012-12-18 DIAGNOSIS — Z15068 Genetic susceptibility to other malignant neoplasm of digestive system: Secondary | ICD-10-CM

## 2012-12-18 DIAGNOSIS — R946 Abnormal results of thyroid function studies: Secondary | ICD-10-CM

## 2012-12-18 DIAGNOSIS — Z9010 Acquired absence of unspecified breast and nipple: Secondary | ICD-10-CM

## 2012-12-18 DIAGNOSIS — E785 Hyperlipidemia, unspecified: Secondary | ICD-10-CM

## 2012-12-18 DIAGNOSIS — R739 Hyperglycemia, unspecified: Secondary | ICD-10-CM

## 2012-12-18 DIAGNOSIS — Z1501 Genetic susceptibility to malignant neoplasm of breast: Secondary | ICD-10-CM

## 2012-12-18 DIAGNOSIS — Z0000 Encounter for general adult medical examination without abnormal findings: Secondary | ICD-10-CM

## 2012-12-18 DIAGNOSIS — Z9013 Acquired absence of bilateral breasts and nipples: Secondary | ICD-10-CM

## 2012-12-18 DIAGNOSIS — Z1502 Genetic susceptibility to malignant neoplasm of ovary: Secondary | ICD-10-CM

## 2012-12-18 DIAGNOSIS — R7309 Other abnormal glucose: Secondary | ICD-10-CM

## 2012-12-18 NOTE — Patient Instructions (Addendum)
Change over to   otc vit d 3  -  2000 iu per day .   And then check  Vit d level and hg a1c then . In  About 4 months or so  No ov needed if ok.  Healthy weight loss will be helpful.   wil arrange a thryoid ultrasound  Expect it to be ok .  Someone will contact you about this .  Get your colonoscopy.     Preventive Care for Adults, Female A healthy lifestyle and preventive care can promote health and wellness. Preventive health guidelines for women include the following key practices.  A routine yearly physical is a good way to check with your caregiver about your health and preventive screening. It is a chance to share any concerns and updates on your health, and to receive a thorough exam.  Visit your dentist for a routine exam and preventive care every 6 months. Brush your teeth twice a day and floss once a day. Good oral hygiene prevents tooth decay and gum disease.  The frequency of eye exams is based on your age, health, family medical history, use of contact lenses, and other factors. Follow your caregiver's recommendations for frequency of eye exams.  Eat a healthy diet. Foods like vegetables, fruits, whole grains, low-fat dairy products, and lean protein foods contain the nutrients you need without too many calories. Decrease your intake of foods high in solid fats, added sugars, and salt. Eat the right amount of calories for you.Get information about a proper diet from your caregiver, if necessary.  Regular physical exercise is one of the most important things you can do for your health. Most adults should get at least 150 minutes of moderate-intensity exercise (any activity that increases your heart rate and causes you to sweat) each week. In addition, most adults need muscle-strengthening exercises on 2 or more days a week.  Maintain a healthy weight. The body mass index (BMI) is a screening tool to identify possible weight problems. It provides an estimate of body fat based on  height and weight. Your caregiver can help determine your BMI, and can help you achieve or maintain a healthy weight.For adults 20 years and older:  A BMI below 18.5 is considered underweight.  A BMI of 18.5 to 24.9 is normal.  A BMI of 25 to 29.9 is considered overweight.  A BMI of 30 and above is considered obese.  Maintain normal blood lipids and cholesterol levels by exercising and minimizing your intake of saturated fat. Eat a balanced diet with plenty of fruit and vegetables. Blood tests for lipids and cholesterol should begin at age 74 and be repeated every 5 years. If your lipid or cholesterol levels are high, you are over 50, or you are at high risk for heart disease, you may need your cholesterol levels checked more frequently.Ongoing high lipid and cholesterol levels should be treated with medicines if diet and exercise are not effective.  If you smoke, find out from your caregiver how to quit. If you do not use tobacco, do not start.  If you are pregnant, do not drink alcohol. If you are breastfeeding, be very cautious about drinking alcohol. If you are not pregnant and choose to drink alcohol, do not exceed 1 drink per day. One drink is considered to be 12 ounces (355 mL) of beer, 5 ounces (148 mL) of wine, or 1.5 ounces (44 mL) of liquor.  Avoid use of street drugs. Do not share needles with  anyone. Ask for help if you need support or instructions about stopping the use of drugs.  High blood pressure causes heart disease and increases the risk of stroke. Your blood pressure should be checked at least every 1 to 2 years. Ongoing high blood pressure should be treated with medicines if weight loss and exercise are not effective.  If you are 52 to 56 years old, ask your caregiver if you should take aspirin to prevent strokes.  Diabetes screening involves taking a blood sample to check your fasting blood sugar level. This should be done once every 3 years, after age 44, if you are  within normal weight and without risk factors for diabetes. Testing should be considered at a younger age or be carried out more frequently if you are overweight and have at least 1 risk factor for diabetes.  Breast cancer screening is essential preventive care for women. You should practice "breast self-awareness." This means understanding the normal appearance and feel of your breasts and may include breast self-examination. Any changes detected, no matter how small, should be reported to a caregiver. Women in their 61s and 30s should have a clinical breast exam (CBE) by a caregiver as part of a regular health exam every 1 to 3 years. After age 85, women should have a CBE every year. Starting at age 66, women should consider having a mammography (breast X-ray test) every year. Women who have a family history of breast cancer should talk to their caregiver about genetic screening. Women at a high risk of breast cancer should talk to their caregivers about having magnetic resonance imaging (MRI) and a mammography every year.  The Pap test is a screening test for cervical cancer. A Pap test can show cell changes on the cervix that might become cervical cancer if left untreated. A Pap test is a procedure in which cells are obtained and examined from the lower end of the uterus (cervix).  Women should have a Pap test starting at age 43.  Between ages 11 and 37, Pap tests should be repeated every 2 years.  Beginning at age 68, you should have a Pap test every 3 years as long as the past 3 Pap tests have been normal.  Some women have medical problems that increase the chance of getting cervical cancer. Talk to your caregiver about these problems. It is especially important to talk to your caregiver if a new problem develops soon after your last Pap test. In these cases, your caregiver may recommend more frequent screening and Pap tests.  The above recommendations are the same for women who have or have not  gotten the vaccine for human papillomavirus (HPV).  If you had a hysterectomy for a problem that was not cancer or a condition that could lead to cancer, then you no longer need Pap tests. Even if you no longer need a Pap test, a regular exam is a good idea to make sure no other problems are starting.  If you are between ages 37 and 42, and you have had normal Pap tests going back 10 years, you no longer need Pap tests. Even if you no longer need a Pap test, a regular exam is a good idea to make sure no other problems are starting.  If you have had past treatment for cervical cancer or a condition that could lead to cancer, you need Pap tests and screening for cancer for at least 20 years after your treatment.  If Pap tests have  been discontinued, risk factors (such as a new sexual partner) need to be reassessed to determine if screening should be resumed.  The HPV test is an additional test that may be used for cervical cancer screening. The HPV test looks for the virus that can cause the cell changes on the cervix. The cells collected during the Pap test can be tested for HPV. The HPV test could be used to screen women aged 24 years and older, and should be used in women of any age who have unclear Pap test results. After the age of 16, women should have HPV testing at the same frequency as a Pap test.  Colorectal cancer can be detected and often prevented. Most routine colorectal cancer screening begins at the age of 87 and continues through age 82. However, your caregiver may recommend screening at an earlier age if you have risk factors for colon cancer. On a yearly basis, your caregiver may provide home test kits to check for hidden blood in the stool. Use of a small camera at the end of a tube, to directly examine the colon (sigmoidoscopy or colonoscopy), can detect the earliest forms of colorectal cancer. Talk to your caregiver about this at age 22, when routine screening begins. Direct  examination of the colon should be repeated every 5 to 10 years through age 71, unless early forms of pre-cancerous polyps or small growths are found.  Hepatitis C blood testing is recommended for all people born from [birth year] through 1965 and any individual with known risks for hepatitis C.  Practice safe sex. Use condoms and avoid high-risk sexual practices to reduce the spread of sexually transmitted infections (STIs). STIs include gonorrhea, chlamydia, syphilis, trichomonas, herpes, HPV, and human immunodeficiency virus (HIV). Herpes, HIV, and HPV are viral illnesses that have no cure. They can result in disability, cancer, and death. Sexually active women aged 88 and younger should be checked for chlamydia. Older women with new or multiple partners should also be tested for chlamydia. Testing for other STIs is recommended if you are sexually active and at increased risk.  Osteoporosis is a disease in which the bones lose minerals and strength with aging. This can result in serious bone fractures. The risk of osteoporosis can be identified using a bone density scan. Women ages 21 and over and women at risk for fractures or osteoporosis should discuss screening with their caregivers. Ask your caregiver whether you should take a calcium supplement or vitamin D to reduce the rate of osteoporosis.  Menopause can be associated with physical symptoms and risks. Hormone replacement therapy is available to decrease symptoms and risks. You should talk to your caregiver about whether hormone replacement therapy is right for you.  Use sunscreen with sun protection factor (SPF) of 30 or more. Apply sunscreen liberally and repeatedly throughout the day. You should seek shade when your shadow is shorter than you. Protect yourself by wearing long sleeves, pants, a wide-brimmed hat, and sunglasses year round, whenever you are outdoors.  Once a month, do a whole body skin exam, using a mirror to look at the skin on your  back. Notify your caregiver of new moles, moles that have irregular borders, moles that are larger than a pencil eraser, or moles that have changed in shape or color.  Stay current with required immunizations.  Influenza. You need a dose every fall (or winter). The composition of the flu vaccine changes each year, so being vaccinated once is not enough.  Pneumococcal polysaccharide. You  need 1 to 2 doses if you smoke cigarettes or if you have certain chronic medical conditions. You need 1 dose at age 29 (or older) if you have never been vaccinated.  Tetanus, diphtheria, pertussis (Tdap, Td). Get 1 dose of Tdap vaccine if you are younger than age 58, are over 83 and have contact with an infant, are a Research scientist (physical sciences), are pregnant, or simply want to be protected from whooping cough. After that, you need a Td booster dose every 10 years. Consult your caregiver if you have not had at least 3 tetanus and diphtheria-containing shots sometime in your life or have a deep or dirty wound.  HPV. You need this vaccine if you are a woman age 39 or younger. The vaccine is given in 3 doses over 6 months.  Measles, mumps, rubella (MMR). You need at least 1 dose of MMR if you were born in 1957 or later. You may also need a second dose.  Meningococcal. If you are age 70 to 44 and a first-year college student living in a residence hall, or have one of several medical conditions, you need to get vaccinated against meningococcal disease. You may also need additional booster doses.  Zoster (shingles). If you are age 71 or older, you should get this vaccine.  Varicella (chickenpox). If you have never had chickenpox or you were vaccinated but received only 1 dose, talk to your caregiver to find out if you need this vaccine.  Hepatitis A. You need this vaccine if you have a specific risk factor for hepatitis A virus infection or you simply wish to be protected from this disease. The vaccine is usually given as 2 doses,  6 to 18 months apart.  Hepatitis B. You need this vaccine if you have a specific risk factor for hepatitis B virus infection or you simply wish to be protected from this disease. The vaccine is given in 3 doses, usually over 6 months. Preventive Services / Frequency Ages 63 to 71  Blood pressure check.** / Every 1 to 2 years.  Lipid and cholesterol check.** / Every 5 years beginning at age 43.  Clinical breast exam.** / Every 3 years for women in their 10s and 30s.  Pap test.** / Every 2 years from ages 75 through 54. Every 3 years starting at age 55 through age 25 or 93 with a history of 3 consecutive normal Pap tests.  HPV screening.** / Every 3 years from ages 9 through ages 20 to 51 with a history of 3 consecutive normal Pap tests.  Hepatitis C blood test.** / For any individual with known risks for hepatitis C.  Skin self-exam. / Monthly.  Influenza immunization.** / Every year.  Pneumococcal polysaccharide immunization.** / 1 to 2 doses if you smoke cigarettes or if you have certain chronic medical conditions.  Tetanus, diphtheria, pertussis (Tdap, Td) immunization. / A one-time dose of Tdap vaccine. After that, you need a Td booster dose every 10 years.  HPV immunization. / 3 doses over 6 months, if you are 70 and younger.  Measles, mumps, rubella (MMR) immunization. / You need at least 1 dose of MMR if you were born in 1957 or later. You may also need a second dose.  Meningococcal immunization. / 1 dose if you are age 27 to 58 and a first-year college student living in a residence hall, or have one of several medical conditions, you need to get vaccinated against meningococcal disease. You may also need additional booster doses.  Varicella immunization.** / Consult your caregiver.  Hepatitis A immunization.** / Consult your caregiver. 2 doses, 6 to 18 months apart.  Hepatitis B immunization.** / Consult your caregiver. 3 doses usually over 6 months. Ages 42 to  69  Blood pressure check.** / Every 1 to 2 years.  Lipid and cholesterol check.** / Every 5 years beginning at age 64.  Clinical breast exam.** / Every year after age 79.  Mammogram.** / Every year beginning at age 1 and continuing for as long as you are in good health. Consult with your caregiver.  Pap test.** / Every 3 years starting at age 86 through age 32 or 23 with a history of 3 consecutive normal Pap tests.  HPV screening.** / Every 3 years from ages 82 through ages 70 to 40 with a history of 3 consecutive normal Pap tests.  Fecal occult blood test (FOBT) of stool. / Every year beginning at age 25 and continuing until age 13. You may not need to do this test if you get a colonoscopy every 10 years.  Flexible sigmoidoscopy or colonoscopy.** / Every 5 years for a flexible sigmoidoscopy or every 10 years for a colonoscopy beginning at age 51 and continuing until age 71.  Hepatitis C blood test.** / For all people born from [birth year] through 1964 and any individual with known risks for hepatitis C.  Skin self-exam. / Monthly.  Influenza immunization.** / Every year.  Pneumococcal polysaccharide immunization.** / 1 to 2 doses if you smoke cigarettes or if you have certain chronic medical conditions.  Tetanus, diphtheria, pertussis (Tdap, Td) immunization.** / A one-time dose of Tdap vaccine. After that, you need a Td booster dose every 10 years.  Measles, mumps, rubella (MMR) immunization. / You need at least 1 dose of MMR if you were born in 1957 or later. You may also need a second dose.  Varicella immunization.** / Consult your caregiver.  Meningococcal immunization.** / Consult your caregiver.  Hepatitis A immunization.** / Consult your caregiver. 2 doses, 6 to 18 months apart.  Hepatitis B immunization.** / Consult your caregiver. 3 doses, usually over 6 months. Ages 63 and over  Blood pressure check.** / Every 1 to 2 years.  Lipid and cholesterol check.** / Every 5 years  beginning at age 76.  Clinical breast exam.** / Every year after age 66.  Mammogram.** / Every year beginning at age 59 and continuing for as long as you are in good health. Consult with your caregiver.  Pap test.** / Every 3 years starting at age 73 through age 42 or 58 with a 3 consecutive normal Pap tests. Testing can be stopped between 65 and 70 with 3 consecutive normal Pap tests and no abnormal Pap or HPV tests in the past 10 years.  HPV screening.** / Every 3 years from ages 79 through ages 66 or 27 with a history of 3 consecutive normal Pap tests. Testing can be stopped between 65 and 70 with 3 consecutive normal Pap tests and no abnormal Pap or HPV tests in the past 10 years.  Fecal occult blood test (FOBT) of stool. / Every year beginning at age 61 and continuing until age 90. You may not need to do this test if you get a colonoscopy every 10 years.  Flexible sigmoidoscopy or colonoscopy.** / Every 5 years for a flexible sigmoidoscopy or every 10 years for a colonoscopy beginning at age 90 and continuing until age 66.  Hepatitis C blood test.** / For all people born  from 1944 through 1965 and any individual with known risks for hepatitis C.  Osteoporosis screening.** / A one-time screening for women ages 97 and over and women at risk for fractures or osteoporosis.  Skin self-exam. / Monthly.  Influenza immunization.** / Every year.  Pneumococcal polysaccharide immunization.** / 1 dose at age 18 (or older) if you have never been vaccinated.  Tetanus, diphtheria, pertussis (Tdap, Td) immunization. / A one-time dose of Tdap vaccine if you are over 65 and have contact with an infant, are a Research scientist (physical sciences), or simply want to be protected from whooping cough. After that, you need a Td booster dose every 10 years.  Varicella immunization.** / Consult your caregiver.  Meningococcal immunization.** / Consult your caregiver.  Hepatitis A immunization.** / Consult your caregiver. 2  doses, 6 to 18 months apart.  Hepatitis B immunization.** / Check with your caregiver. 3 doses, usually over 6 months. ** Family history and personal history of risk and conditions may change your caregiver's recommendations. Document Released: 09/21/2001 Document Revised: 10/18/2011 Document Reviewed: 12/21/2010 [facility] Patient Information 2013 [place], [place].

## 2012-12-18 NOTE — Progress Notes (Signed)
Chief Complaint  Patient presents with  . Annual Exam    No pap    HPI: Patient comes in today for Preventive Health Care visit  No major change in health status since last visit . She did get life line screening which were normal except for some questionable abnormalities in her thyroid gland on the carotid screening. Also had an episode of acute gastroenteritis where she was seen in urgent care and is now better. Has taken align  No recurrence of atrial fibrillation ever since her surgery a few years ago. Doesn't see cardiology anymore. Has been taking 50,000 units vitamin D initially weekly and now every other week. Has been prescribed by her OB/GYN office. Was told that the over the counters or not as well as a work. But a trial adenopathy given.  ROS:  GEN/ HEENT: No fever, significant weight changes sweats headaches vision problems hearing changes, CV/ PULM; No chest pain shortness of breath cough, syncope,edema  change in exercise tolerance. GI /GU: No adominal pain, vomiting, change in bowel habits. No blood in the stool. No significant GU symptoms. SKIN/HEME: ,no acute skin rashes suspicious lesions or bleeding. No lymphadenopathy, nodules, masses.  NEURO/ PSYCH:  No neurologic signs such as weakness numbness. No depression anxiety. IMM/ Allergy: No unusual infections.  Allergy .   REST of 12 system review negative except as per HPI   Past Medical History  Diagnosis Date  . Hyperlipidemia   . H/O: hysterectomy   . Hyperglycemia   . BRCA gene positive   . Atrial fibrillation 04/28/2011   Past Surgical History  Procedure Laterality Date  . Cesarean section      x 3  . Mastectomy      bilateral  . Abdominal hysterectomy  fall 2009    Family History  Problem Relation Age of Onset  . Breast cancer Sister   . Diabetes Other     first degree relative   . Hypertension      family hx of    History   Social History  . Marital Status: Widowed    Spouse Name: [name]     Number of Children: [name]  . Years of Education: [name]   Occupational History  . works at [employer]    Social History Main Topics  . Smoking status: Never Smoker   . Smokeless tobacco: Never Used  . Alcohol Use: No  . Drug Use: No  . Sexually Active: None   Other Topics Concern  . None   Social History Narrative   Widowed October 2009   Regular Exercise-yes   Plans on bm txp donor to her sis in the spring at [facility]   Husband died from septicemia from bowel?   Works at [employer] [place]   Has now sold her house and living with her son.  They buy a condo is trying to eat healthier   Sleep 6-7 hours     Outpatient Encounter Prescriptions as of 12/18/2012  Medication Sig Dispense Refill  . calcium carbonate (OS-CAL) 1250 MG chewable tablet Chew 1 tablet by mouth daily.        . fish oil-omega-3 fatty acids 1000 MG capsule Take 1 g by mouth daily.        . naproxen (NAPROSYN) 500 MG tablet Take 1 tablet (500 mg total) by mouth 2 (two) times daily with a meal.  40 tablet  0  . Vitamin D, Ergocalciferol, (DRISDOL) 50000 UNITS CAPS Take  50,000 Units by mouth every 7 (seven) days.        . [DISCONTINUED] pseudoephedrine-guaifenesin (MUCINEX D) 60-600 MG per tablet Take 1 tablet by mouth every 12 (twelve) hours.       No facility-administered encounter medications on file as of 12/18/2012.    EXAM:  BP 100/60  Pulse 68  Temp(Src) 98.2 F (36.8 C) (Oral)  Ht 5\' 7"  (1.702 m)  Wt 214 lb (97.07 kg)  BMI 33.51 kg/m2  SpO2 98%  Body mass index is 33.51 kg/(m^2).  Physical Exam: Vital signs reviewed [id number] is a well-developed well-nourished alert cooperative   female who appears her stated age in no acute distress.  HEENT: normocephalic atraumatic , Eyes: PERRL EOM's full, conjunctiva clear, Nares: paten,t no deformity discharge or tenderness., Ears: no deformity EAC's clear TMs with normal landmarks. Mouth: clear OP, no lesions, edema.  Moist mucous membranes.  Dentition in adequate repair. NECK: supple without masses, thyromegaly or bruits. Thyroid barely palpable I don't feel a nodule CHEST/PULM:  Clear to auscultation and percussion breath sounds equal no wheeze , rales or rhonchi. No chest wall deformities or tenderness. Breast absent surgically axilla is clear CV: PMI is nondisplaced, S1 S2 no gallops, murmurs, rubs. Peripheral pulses are full without delay.No JVD .  ABDOMEN: Bowel sounds normal nontender  No guard or rebound, no hepato splenomegal no CVA tenderness.  No hernia. Extremtities:  No clubbing cyanosis or edema, no acute joint swelling or redness no focal atrophy NEURO:  Oriented x3, cranial nerves 3-12 appear to be intact, no obvious focal weakness,gait within normal limits no abnormal reflexes or asymmetrical SKIN: No acute rashes normal turgor, color, no bruising or petechiae. PSYCH: Oriented, good eye contact, no obvious depression anxiety, cognition and judgment appear normal. LN: no cervical axillary inguinal adenopathy  Lab Results  Component Value Date   WBC 4.6 12/11/2012   HGB 12.9 12/11/2012   HCT 38.0 12/11/2012   PLT 157.0 12/11/2012   GLUCOSE 105* 12/11/2012   CHOL 201* 12/11/2012   TRIG 60.0 12/11/2012   HDL 48.90 12/11/2012   LDLDIRECT 139.5 12/11/2012   LDLCALC 138* 05/27/2010   ALT 18 12/11/2012   AST 18 12/11/2012   NA 140 12/11/2012   K 4.7 12/11/2012   CL 107 12/11/2012   CREATININE 0.7 12/11/2012   BUN 21 12/11/2012   CO2 25 12/11/2012   TSH 0.71 12/11/2012   HGBA1C 5.5 05/09/2007    ASSESSMENT AND PLAN:  Discussed the following assessment and plan:  Visit for preventive health examination  Unspecified vitamin D deficiency - History of high-dose taking 50,000 every other week transition to otc 2000 /d and dheck level  Hyperglycemia - lsi a1c in 4 months or so.   Abnormal thyroid ultrasound - Incidental finding on community screening check thyroid ultrasound - Plan: US Soft Tissue Head/Neck  BRCA gene positive  Acquired  absence of breast and nipple, bilateral  Hyperlipidemia  Abnormal thyroid ultrasound - Plan: US Soft Tissue Head/Neck  Patient Care Team: Madelin Headings, MD as PCP - General Verlee Rossetti, MD (Orthopedic Surgery) Alison Murray, MD as Attending Physician (Obstetrics and Gynecology) Charna Elizabeth, MD as Attending Physician (Gastroenterology) Patient Instructions  Change over to   otc vit d 3  -  2000 iu per day .   And then check  Vit d level and hg a1c then . In  About 4 months or so  No ov needed if ok.  Healthy weight loss will be  helpful.   wil arrange a thryoid ultrasound  Expect it to be ok .  Someone will contact you about this .  Get your colonoscopy.     Preventive Care for Adults, Female A healthy lifestyle and preventive care can promote health and wellness. Preventive health guidelines for women include the following key practices.  A routine yearly physical is a good way to check with your caregiver about your health and preventive screening. It is a chance to share any concerns and updates on your health, and to receive a thorough exam.  Visit your dentist for a routine exam and preventive care every 6 months. Brush your teeth twice a day and floss once a day. Good oral hygiene prevents tooth decay and gum disease.  The frequency of eye exams is based on your age, health, family medical history, use of contact lenses, and other factors. Follow your caregiver's recommendations for frequency of eye exams.  Eat a healthy diet. Foods like vegetables, fruits, whole grains, low-fat dairy products, and lean protein foods contain the nutrients you need without too many calories. Decrease your intake of foods high in solid fats, added sugars, and salt. Eat the right amount of calories for you.Get information about a proper diet from your caregiver, if necessary.  Regular physical exercise is one of the most important things you can do for your health. Most adults should get at  least 150 minutes of moderate-intensity exercise (any activity that increases your heart rate and causes you to sweat) each week. In addition, most adults need muscle-strengthening exercises on 2 or more days a week.  Maintain a healthy weight. The body mass index (BMI) is a screening tool to identify possible weight problems. It provides an estimate of body fat based on height and weight. Your caregiver can help determine your BMI, and can help you achieve or maintain a healthy weight.For adults 20 years and older:  A BMI below 18.5 is considered underweight.  A BMI of 18.5 to 24.9 is normal.  A BMI of 25 to 29.9 is considered overweight.  A BMI of 30 and above is considered obese.  Maintain normal blood lipids and cholesterol levels by exercising and minimizing your intake of saturated fat. Eat a balanced diet with plenty of fruit and vegetables. Blood tests for lipids and cholesterol should begin at age 44 and be repeated every 5 years. If your lipid or cholesterol levels are high, you are over 50, or you are at high risk for heart disease, you may need your cholesterol levels checked more frequently.Ongoing high lipid and cholesterol levels should be treated with medicines if diet and exercise are not effective.  If you smoke, find out from your caregiver how to quit. If you do not use tobacco, do not start.  If you are pregnant, do not drink alcohol. If you are breastfeeding, be very cautious about drinking alcohol. If you are not pregnant and choose to drink alcohol, do not exceed 1 drink per day. One drink is considered to be 12 ounces (355 mL) of beer, 5 ounces (148 mL) of wine, or 1.5 ounces (44 mL) of liquor.  Avoid use of street drugs. Do not share needles with anyone. Ask for help if you need support or instructions about stopping the use of drugs.  High blood pressure causes heart disease and increases the risk of stroke. Your blood pressure should be checked at least every 1 to 2  years. Ongoing high blood pressure should be treated  with medicines if weight loss and exercise are not effective.  If you are 56 to 56 years old, ask your caregiver if you should take aspirin to prevent strokes.  Diabetes screening involves taking a blood sample to check your fasting blood sugar level. This should be done once every 3 years, after age 108, if you are within normal weight and without risk factors for diabetes. Testing should be considered at a younger age or be carried out more frequently if you are overweight and have at least 1 risk factor for diabetes.  Breast cancer screening is essential preventive care for women. You should practice "breast self-awareness." This means understanding the normal appearance and feel of your breasts and may include breast self-examination. Any changes detected, no matter how small, should be reported to a caregiver. Women in their 37s and 30s should have a clinical breast exam (CBE) by a caregiver as part of a regular health exam every 1 to 3 years. After age 80, women should have a CBE every year. Starting at age 53, women should consider having a mammography (breast X-ray test) every year. Women who have a family history of breast cancer should talk to their caregiver about genetic screening. Women at a high risk of breast cancer should talk to their caregivers about having magnetic resonance imaging (MRI) and a mammography every year.  The Pap test is a screening test for cervical cancer. A Pap test can show cell changes on the cervix that might become cervical cancer if left untreated. A Pap test is a procedure in which cells are obtained and examined from the lower end of the uterus (cervix).  Women should have a Pap test starting at age 74.  Between ages 62 and 57, Pap tests should be repeated every 2 years.  Beginning at age 41, you should have a Pap test every 3 years as long as the past 3 Pap tests have been normal.  Some women have medical  problems that increase the chance of getting cervical cancer. Talk to your caregiver about these problems. It is especially important to talk to your caregiver if a new problem develops soon after your last Pap test. In these cases, your caregiver may recommend more frequent screening and Pap tests.  The above recommendations are the same for women who have or have not gotten the vaccine for human papillomavirus (HPV).  If you had a hysterectomy for a problem that was not cancer or a condition that could lead to cancer, then you no longer need Pap tests. Even if you no longer need a Pap test, a regular exam is a good idea to make sure no other problems are starting.  If you are between ages 29 and 86, and you have had normal Pap tests going back 10 years, you no longer need Pap tests. Even if you no longer need a Pap test, a regular exam is a good idea to make sure no other problems are starting.  If you have had past treatment for cervical cancer or a condition that could lead to cancer, you need Pap tests and screening for cancer for at least 20 years after your treatment.  If Pap tests have been discontinued, risk factors (such as a new sexual partner) need to be reassessed to determine if screening should be resumed.  The HPV test is an additional test that may be used for cervical cancer screening. The HPV test looks for the virus that can cause the cell changes  on the cervix. The cells collected during the Pap test can be tested for HPV. The HPV test could be used to screen women aged 17 years and older, and should be used in women of any age who have unclear Pap test results. After the age of 77, women should have HPV testing at the same frequency as a Pap test.  Colorectal cancer can be detected and often prevented. Most routine colorectal cancer screening begins at the age of 17 and continues through age 81. However, your caregiver may recommend screening at an earlier age if you have risk  factors for colon cancer. On a yearly basis, your caregiver may provide home test kits to check for hidden blood in the stool. Use of a small camera at the end of a tube, to directly examine the colon (sigmoidoscopy or colonoscopy), can detect the earliest forms of colorectal cancer. Talk to your caregiver about this at age 39, when routine screening begins. Direct examination of the colon should be repeated every 5 to 10 years through age 12, unless early forms of pre-cancerous polyps or small growths are found.  Hepatitis C blood testing is recommended for all people born from [birth year] through 1965 and any individual with known risks for hepatitis C.  Practice safe sex. Use condoms and avoid high-risk sexual practices to reduce the spread of sexually transmitted infections (STIs). STIs include gonorrhea, chlamydia, syphilis, trichomonas, herpes, HPV, and human immunodeficiency virus (HIV). Herpes, HIV, and HPV are viral illnesses that have no cure. They can result in disability, cancer, and death. Sexually active women aged 1 and younger should be checked for chlamydia. Older women with new or multiple partners should also be tested for chlamydia. Testing for other STIs is recommended if you are sexually active and at increased risk.  Osteoporosis is a disease in which the bones lose minerals and strength with aging. This can result in serious bone fractures. The risk of osteoporosis can be identified using a bone density scan. Women ages 83 and over and women at risk for fractures or osteoporosis should discuss screening with their caregivers. Ask your caregiver whether you should take a calcium supplement or vitamin D to reduce the rate of osteoporosis.  Menopause can be associated with physical symptoms and risks. Hormone replacement therapy is available to decrease symptoms and risks. You should talk to your caregiver about whether hormone replacement therapy is right for you.  Use sunscreen with sun  protection factor (SPF) of 30 or more. Apply sunscreen liberally and repeatedly throughout the day. You should seek shade when your shadow is shorter than you. Protect yourself by wearing long sleeves, pants, a wide-brimmed hat, and sunglasses year round, whenever you are outdoors.  Once a month, do a whole body skin exam, using a mirror to look at the skin on your back. Notify your caregiver of new moles, moles that have irregular borders, moles that are larger than a pencil eraser, or moles that have changed in shape or color.  Stay current with required immunizations.  Influenza. You need a dose every fall (or winter). The composition of the flu vaccine changes each year, so being vaccinated once is not enough.  Pneumococcal polysaccharide. You need 1 to 2 doses if you smoke cigarettes or if you have certain chronic medical conditions. You need 1 dose at age 48 (or older) if you have never been vaccinated.  Tetanus, diphtheria, pertussis (Tdap, Td). Get 1 dose of Tdap vaccine if you are younger than age  65, are over 65 and have contact with an infant, are a Research scientist (physical sciences), are pregnant, or simply want to be protected from whooping cough. After that, you need a Td booster dose every 10 years. Consult your caregiver if you have not had at least 3 tetanus and diphtheria-containing shots sometime in your life or have a deep or dirty wound.  HPV. You need this vaccine if you are a woman age 41 or younger. The vaccine is given in 3 doses over 6 months.  Measles, mumps, rubella (MMR). You need at least 1 dose of MMR if you were born in 1957 or later. You may also need a second dose.  Meningococcal. If you are age 68 to 34 and a first-year college student living in a residence hall, or have one of several medical conditions, you need to get vaccinated against meningococcal disease. You may also need additional booster doses.  Zoster (shingles). If you are age 75 or older, you should get this  vaccine.  Varicella (chickenpox). If you have never had chickenpox or you were vaccinated but received only 1 dose, talk to your caregiver to find out if you need this vaccine.  Hepatitis A. You need this vaccine if you have a specific risk factor for hepatitis A virus infection or you simply wish to be protected from this disease. The vaccine is usually given as 2 doses, 6 to 18 months apart.  Hepatitis B. You need this vaccine if you have a specific risk factor for hepatitis B virus infection or you simply wish to be protected from this disease. The vaccine is given in 3 doses, usually over 6 months. Preventive Services / Frequency Ages 47 to 70  Blood pressure check.** / Every 1 to 2 years.  Lipid and cholesterol check.** / Every 5 years beginning at age 8.  Clinical breast exam.** / Every 3 years for women in their 69s and 30s.  Pap test.** / Every 2 years from ages 74 through 55. Every 3 years starting at age 63 through age 38 or 71 with a history of 3 consecutive normal Pap tests.  HPV screening.** / Every 3 years from ages 67 through ages 30 to 16 with a history of 3 consecutive normal Pap tests.  Hepatitis C blood test.** / For any individual with known risks for hepatitis C.  Skin self-exam. / Monthly.  Influenza immunization.** / Every year.  Pneumococcal polysaccharide immunization.** / 1 to 2 doses if you smoke cigarettes or if you have certain chronic medical conditions.  Tetanus, diphtheria, pertussis (Tdap, Td) immunization. / A one-time dose of Tdap vaccine. After that, you need a Td booster dose every 10 years.  HPV immunization. / 3 doses over 6 months, if you are 16 and younger.  Measles, mumps, rubella (MMR) immunization. / You need at least 1 dose of MMR if you were born in 1957 or later. You may also need a second dose.  Meningococcal immunization. / 1 dose if you are age 12 to 38 and a first-year college student living in a residence hall, or have one of  several medical conditions, you need to get vaccinated against meningococcal disease. You may also need additional booster doses.  Varicella immunization.** / Consult your caregiver.  Hepatitis A immunization.** / Consult your caregiver. 2 doses, 6 to 18 months apart.  Hepatitis B immunization.** / Consult your caregiver. 3 doses usually over 6 months. Ages 39 to 33  Blood pressure check.** / Every 1 to 2 years.  Lipid and cholesterol check.** / Every 5 years beginning at age 7.  Clinical breast exam.** / Every year after age 50.  Mammogram.** / Every year beginning at age 89 and continuing for as long as you are in good health. Consult with your caregiver.  Pap test.** / Every 3 years starting at age 37 through age 13 or 6 with a history of 3 consecutive normal Pap tests.  HPV screening.** / Every 3 years from ages 51 through ages 63 to 44 with a history of 3 consecutive normal Pap tests.  Fecal occult blood test (FOBT) of stool. / Every year beginning at age 69 and continuing until age 91. You may not need to do this test if you get a colonoscopy every 10 years.  Flexible sigmoidoscopy or colonoscopy.** / Every 5 years for a flexible sigmoidoscopy or every 10 years for a colonoscopy beginning at age 33 and continuing until age 37.  Hepatitis C blood test.** / For all people born from [birth year] through 1964 and any individual with known risks for hepatitis C.  Skin self-exam. / Monthly.  Influenza immunization.** / Every year.  Pneumococcal polysaccharide immunization.** / 1 to 2 doses if you smoke cigarettes or if you have certain chronic medical conditions.  Tetanus, diphtheria, pertussis (Tdap, Td) immunization.** / A one-time dose of Tdap vaccine. After that, you need a Td booster dose every 10 years.  Measles, mumps, rubella (MMR) immunization. / You need at least 1 dose of MMR if you were born in 1957 or later. You may also need a second dose.  Varicella immunization.** /  Consult your caregiver.  Meningococcal immunization.** / Consult your caregiver.  Hepatitis A immunization.** / Consult your caregiver. 2 doses, 6 to 18 months apart.  Hepatitis B immunization.** / Consult your caregiver. 3 doses, usually over 6 months. Ages 28 and over  Blood pressure check.** / Every 1 to 2 years.  Lipid and cholesterol check.** / Every 5 years beginning at age 26.  Clinical breast exam.** / Every year after age 59.  Mammogram.** / Every year beginning at age 43 and continuing for as long as you are in good health. Consult with your caregiver.  Pap test.** / Every 3 years starting at age 30 through age 18 or 73 with a 3 consecutive normal Pap tests. Testing can be stopped between 65 and 70 with 3 consecutive normal Pap tests and no abnormal Pap or HPV tests in the past 10 years.  HPV screening.** / Every 3 years from ages 38 through ages 42 or 62 with a history of 3 consecutive normal Pap tests. Testing can be stopped between 65 and 70 with 3 consecutive normal Pap tests and no abnormal Pap or HPV tests in the past 10 years.  Fecal occult blood test (FOBT) of stool. / Every year beginning at age 66 and continuing until age 36. You may not need to do this test if you get a colonoscopy every 10 years.  Flexible sigmoidoscopy or colonoscopy.** / Every 5 years for a flexible sigmoidoscopy or every 10 years for a colonoscopy beginning at age 98 and continuing until age 79.  Hepatitis C blood test.** / For all people born from [birth year] through 1964 and any individual with known risks for hepatitis C.  Osteoporosis screening.** / A one-time screening for women ages 31 and over and women at risk for fractures or osteoporosis.  Skin self-exam. / Monthly.  Influenza immunization.** / Every year.  Pneumococcal polysaccharide immunization.** / 1  dose at age 71 (or older) if you have never been vaccinated.  Tetanus, diphtheria, pertussis (Tdap, Td) immunization. / A one-time dose  of Tdap vaccine if you are over 65 and have contact with an infant, are a Research scientist (physical sciences), or simply want to be protected from whooping cough. After that, you need a Td booster dose every 10 years.  Varicella immunization.** / Consult your caregiver.  Meningococcal immunization.** / Consult your caregiver.  Hepatitis A immunization.** / Consult your caregiver. 2 doses, 6 to 18 months apart.  Hepatitis B immunization.** / Check with your caregiver. 3 doses, usually over 6 months. ** Family history and personal history of risk and conditions may change your caregiver's recommendations. Document Released: 09/21/2001 Document Revised: 10/18/2011 Document Reviewed: 12/21/2010 [facility] Patient Information 2013 [place], [place].     Maria Glass. Maria Glass M.D. Health Maintenance  Topic Date Due  . Mammogram  10/08/2011  . Influenza Vaccine  04/09/2013  . Pap Smear  04/11/2015  . Tetanus/tdap  05/09/2016  . Colonoscopy  01/07/2018   Health Maintenance Review

## 2012-12-26 ENCOUNTER — Telehealth: Payer: Self-pay | Admitting: Internal Medicine

## 2012-12-26 ENCOUNTER — Ambulatory Visit
Admission: RE | Admit: 2012-12-26 | Discharge: 2012-12-26 | Disposition: A | Discharge status: Home / Self Care | Payer: No Typology Code available for payment source | Source: Ambulatory Visit | Attending: Internal Medicine | Admitting: Internal Medicine

## 2012-12-26 DIAGNOSIS — R9389 Abnormal findings on diagnostic imaging of other specified body structures: Secondary | ICD-10-CM

## 2012-12-26 NOTE — Telephone Encounter (Signed)
Patient notified will call when I have the results.

## 2012-12-26 NOTE — Telephone Encounter (Signed)
Patient called stating that she would like a call back with [place] results as soon as they become available. Please assist.

## 2013-03-08 ENCOUNTER — Telehealth: Payer: Self-pay | Admitting: Internal Medicine

## 2013-03-08 NOTE — Telephone Encounter (Signed)
Spoke with patient and she is scheduled for colonoscopy on 9/8 with Dr Loreta Ave. Patient has history of Afib after surgery in 2012. At last ov with Dr Tenny Craw noted to be in SR on exam. Patient states she has had routine follow up visits with PCP. Had a mobile life screening, no mention to her of abnormal EKG will bring copy to office. Patient had a feeling of not feeling quite right after surgery when she was in AFib, denies feeling this way. She does have episodes at time where she needs to catch her breath, last for about a second. Did schedule a follow up in September with Dr Tenny Craw. Advised patient if she starts to feel like she did after surgery to call back, verbalized understanding.

## 2013-03-08 NOTE — Telephone Encounter (Signed)
New Prob      Pt would like to speak to a nurse regarding some symptoms she has been having prior to her upcoming colonoscopy. Please call,.   Please call on cell phone number

## 2013-03-08 NOTE — Telephone Encounter (Signed)
New Prob     Pt would like to speak to a nurse regarding some symptoms she has been having prior to her upcoming colonoscopy. Please call,.

## 2013-03-14 ENCOUNTER — Telehealth: Payer: Self-pay | Admitting: Internal Medicine

## 2013-03-14 NOTE — Telephone Encounter (Signed)
Walk in pt Form " Dropped Off paperwork" Will hold onto Until Ross back in Office 03/14/13/KM

## 2013-03-26 ENCOUNTER — Telehealth: Payer: Self-pay | Admitting: Internal Medicine

## 2013-03-26 NOTE — Telephone Encounter (Signed)
error 

## 2013-03-27 ENCOUNTER — Ambulatory Visit (INDEPENDENT_AMBULATORY_CARE_PROVIDER_SITE_OTHER): Payer: No Typology Code available for payment source | Admitting: Nurse Practitioner

## 2013-03-27 ENCOUNTER — Encounter: Payer: Self-pay | Admitting: Nurse Practitioner

## 2013-03-27 VITALS — BP 120/82 | HR 68 | Ht 67.0 in | Wt 211.4 lb

## 2013-03-27 DIAGNOSIS — I4891 Unspecified atrial fibrillation: Secondary | ICD-10-CM

## 2013-03-27 DIAGNOSIS — I480 Paroxysmal atrial fibrillation: Secondary | ICD-10-CM

## 2013-03-27 NOTE — Patient Instructions (Addendum)
I think you are doing well  Let us know if you have any dizzy/passing out spells or worsening palpitations  Would encourage regular exercise/diet/weight loss   Call the [facility] office at [phone] if you have any questions, problems or concerns.

## 2013-03-27 NOTE — Progress Notes (Signed)
Maria Glass Date of Birth: 27-Oct-1955 Medical Record #[id number]  History of Present Illness: Maria Glass is seen back today for a work in visit. Seen for Dr. Tenny Craw. She has a history of lone AF following shoulder surgery back in 2012. Echo was normal at that time. Other issues include obesity and positive BRCA gene. Sees Dr. Fabian Sharp for her primary care.   Last seen here back in October of 2012 - was doing well. Metoprolol was discontinued. To come back here prn.   Comes back today for a work in visit. Here alone. To have colonoscopy next month with Dr. Loreta Ave. She notes that at times she will feel a "little catch" in her breath - has noted some skipping at times of her heart over the past few weeks- nothing that makes her lightheaded or dizzy. No passing out spells. No chest pain. Trying to walk and work on her weight. Not really a big caffeine user. No alcohol. Not short of breath. She wanted to be proactive about her health in regards to her upcoming procedure.   Current Outpatient Prescriptions  Medication Sig Dispense Refill  . aspirin 81 MG tablet Take 81 mg by mouth daily.      . calcium carbonate (OS-CAL) 1250 MG chewable tablet Chew 1 tablet by mouth daily.        . cholecalciferol (VITAMIN D) 1000 UNITS tablet Take 2,000 Units by mouth daily.      . fish oil-omega-3 fatty acids 1000 MG capsule Take 1 g by mouth daily.         No current facility-administered medications for this visit.    No Known Allergies  Past Medical History  Diagnosis Date  . Hyperlipidemia   . Hyperglycemia   . BRCA gene positive     s/p bilateral mastectomy and hysterectomy.  . Atrial fibrillation 04/28/2011    lone episode following shoulder surgery - normal echo  . Palpitations   . Obesity     Past Surgical History  Procedure Laterality Date  . Cesarean section      x 3  . Mastectomy      bilateral  . Abdominal hysterectomy  fall 2009    History  Smoking status  . Never Smoker     Smokeless tobacco  . Never Used    History  Alcohol Use No    Family History  Problem Relation Age of Onset  . Breast cancer Sister   . Diabetes Other     first degree relative   . Hypertension      family hx of    Review of Systems: The review of systems is per the HPI.  All other systems were reviewed and are negative.  Physical Exam: BP 120/82  Pulse 68  Ht 5\' 7"  (1.702 m)  Wt 211 lb 6.4 oz (95.89 kg)  BMI 33.1 kg/m2 Patient is very pleasant and in no acute distress. She is obese. Skin is warm and dry. Color is normal.  HEENT is unremarkable. Normocephalic/atraumatic. PERRL. Sclera are nonicteric. Neck is supple. No masses. No JVD. Lungs are clear. Cardiac exam shows a regular rate and rhythm. Abdomen is soft. Extremities are without edema. Gait and ROM are intact. No gross neurologic deficits noted.  LABORATORY DATA:  Lab Results  Component Value Date   WBC 4.6 12/11/2012   HGB 12.9 12/11/2012   HCT 38.0 12/11/2012   PLT 157.0 12/11/2012   GLUCOSE 105* 12/11/2012   CHOL 201* 12/11/2012   TRIG  60.0 12/11/2012   HDL 48.90 12/11/2012   LDLDIRECT 139.5 12/11/2012   LDLCALC 138* 05/27/2010   ALT 18 12/11/2012   AST 18 12/11/2012   NA 140 12/11/2012   K 4.7 12/11/2012   CL 107 12/11/2012   CREATININE 0.7 12/11/2012   BUN 21 12/11/2012   CO2 25 12/11/2012   TSH 0.71 12/11/2012   HGBA1C 5.5 05/09/2007   Echo Study Conclusions from September 2012  - Left ventricle: The cavity size was normal. Wall thickness was increased in a pattern of mild LVH. Systolic function was normal. The estimated ejection fraction was in the range of 60% to 65%. Wall motion was normal; there were no regional wall motion abnormalities. Doppler parameters are consistent with abnormal left ventricular relaxation (grade 1 diastolic dysfunction). - Left atrium: The atrium was mildly dilated. - Pulmonary arteries: Systolic pressure was mildly increased. PA peak pressure: 32mm Hg (S).   Assessment / Plan: 1. PAF - one  lone episode back in 2012 following shoulder surgery - no evidence of recurrence - has had normal echocardiogram. I do not think we need to repeat.   2. Obesity - needs to work on [employer] loss/regular exercise - seems motivated to make changes.   3. Palpitations - really not symptomatic. EKG is normal here today. I do not think she needs additional evaluation or medicines at this time. Ok to proceed with her colonoscopy -  Encouraged to stay hydrated with the prep.   See back as needed.   Patient is agreeable to this plan and will call if any problems develop in the interim.   Rosalio Macadamia, RN, ANP-C  [facility] [address] [place], [place]  [address]

## 2013-04-11 ENCOUNTER — Telehealth: Payer: Self-pay | Admitting: Internal Medicine

## 2013-04-11 DIAGNOSIS — E785 Hyperlipidemia, unspecified: Secondary | ICD-10-CM

## 2013-04-11 NOTE — Telephone Encounter (Signed)
PT would also like to have her cholesterol checked at the time of her labs on 9/10. Please assist.

## 2013-04-11 NOTE — Telephone Encounter (Signed)
Ok to add Lipid panel.  

## 2013-04-11 NOTE — Telephone Encounter (Signed)
Lab order placed.

## 2013-04-18 ENCOUNTER — Other Ambulatory Visit (INDEPENDENT_AMBULATORY_CARE_PROVIDER_SITE_OTHER): Payer: No Typology Code available for payment source

## 2013-04-18 ENCOUNTER — Other Ambulatory Visit: Payer: No Typology Code available for payment source

## 2013-04-18 DIAGNOSIS — E785 Hyperlipidemia, unspecified: Secondary | ICD-10-CM

## 2013-04-18 DIAGNOSIS — R739 Hyperglycemia, unspecified: Secondary | ICD-10-CM

## 2013-04-18 DIAGNOSIS — R7309 Other abnormal glucose: Secondary | ICD-10-CM

## 2013-04-18 DIAGNOSIS — E559 Vitamin D deficiency, unspecified: Secondary | ICD-10-CM

## 2013-04-18 LAB — LIPID PANEL
Cholesterol: 202 mg/dL — ABNORMAL HIGH (ref 0–200)
Total CHOL/HDL Ratio: 5
Triglycerides: 48 mg/dL (ref 0.0–149.0)
VLDL: 9.6 mg/dL (ref 0.0–40.0)

## 2013-04-18 LAB — LDL CHOLESTEROL, DIRECT: Direct LDL: 151.3 mg/dL

## 2013-04-18 LAB — HEMOGLOBIN A1C: Hgb A1c MFr Bld: 5.7 % (ref 4.6–6.5)

## 2013-05-04 ENCOUNTER — Ambulatory Visit: Payer: No Typology Code available for payment source | Admitting: Internal Medicine

## 2013-05-09 ENCOUNTER — Telehealth: Payer: Self-pay | Admitting: Obstetrics and Gynecology

## 2013-05-09 NOTE — Telephone Encounter (Signed)
LMTCB. Rescheduled appt with Dr. Edward Jolly due to surgery.

## 2013-05-20 ENCOUNTER — Encounter: Payer: Self-pay | Admitting: Internal Medicine

## 2013-05-22 ENCOUNTER — Encounter: Payer: Self-pay | Admitting: Family Medicine

## 2013-05-23 ENCOUNTER — Ambulatory Visit: Payer: Self-pay | Admitting: Obstetrics and Gynecology

## 2013-05-30 ENCOUNTER — Ambulatory Visit: Payer: Self-pay | Admitting: Obstetrics and Gynecology

## 2013-06-11 ENCOUNTER — Encounter: Payer: Self-pay | Admitting: Obstetrics and Gynecology

## 2013-06-11 ENCOUNTER — Encounter: Payer: Self-pay | Admitting: Internal Medicine

## 2013-06-25 ENCOUNTER — Ambulatory Visit: Payer: Self-pay | Admitting: Obstetrics and Gynecology

## 2013-07-30 ENCOUNTER — Encounter: Payer: Self-pay | Admitting: Obstetrics and Gynecology

## 2013-07-30 ENCOUNTER — Ambulatory Visit (INDEPENDENT_AMBULATORY_CARE_PROVIDER_SITE_OTHER): Payer: No Typology Code available for payment source | Admitting: Obstetrics and Gynecology

## 2013-07-30 VITALS — BP 122/70 | HR 70 | Resp 16 | Ht 67.0 in | Wt 208.0 lb

## 2013-07-30 DIAGNOSIS — Z13820 Encounter for screening for osteoporosis: Secondary | ICD-10-CM

## 2013-07-30 DIAGNOSIS — Z01419 Encounter for gynecological examination (general) (routine) without abnormal findings: Secondary | ICD-10-CM

## 2013-07-30 DIAGNOSIS — Z0000 Encounter for general adult medical examination without abnormal findings: Secondary | ICD-10-CM

## 2013-07-30 LAB — POCT URINALYSIS DIPSTICK
Bilirubin, UA: NEGATIVE
Blood, UA: NEGATIVE
Ketones, UA: NEGATIVE
Protein, UA: NEGATIVE
pH, UA: 5

## 2013-07-30 NOTE — Patient Instructions (Signed)

## 2013-07-30 NOTE — Progress Notes (Signed)
Patient ID: Maria Glass, female   DOB: Nov 16, 1955, 57 y.o.   MRN: [id number] GYNECOLOGY VISIT  PCP:   Berniece Andreas, MD  Referring provider:   HPI: 57 y.o.   Widowed  Caucasian  female   [phone] with Patient's last menstrual period was 05/07/2008.   here for   AEX. No bladder or bowel problems. No hot flashes.   Patient taking over the counter vit D.   Needs to do follow up for thyroid ultrasound ordered through Dr. Fabian Sharp.  Patient will call to schedule.   Hgb:    PCP Urine:  Neg  GYNECOLOGIC HISTORY: Patient's last menstrual period was 05/07/2008. Sexually active:  no Partner preference: female Contraception:   R-TLH/BSO Menopausal hormone therapy:  none DES exposure:   no Blood transfusions:  no Sexually transmitted diseases:   no GYN Procedures:  R-TLH/BSO 2009, prophylactic bilateral mastectomy 2011 Mammogram:   12/2008 wnl              Pap:  03-25-08 wnl  History of abnormal pap smear:  no   OB History   Grav Para Term Preterm Abortions TAB SAB Ect Mult Living   3 3 3       2        LIFESTYLE: Exercise:    walking           Tobacco:    no Alcohol:       no Drug use:    no  OTHER HEALTH MAINTENANCE: Tetanus/TDap:   05/2006 Gardisil:               n/a Influenza:            05/2013 Zostavax:             no  Bone density:      Heel scan with [facility] 08/2012:wnl Colonoscopy:      04/2013 with Dr. Peter Minium polyps.  Next colonoscopy 04/2018.  Cholesterol check:  12/2012 borderline  Family History  Problem Relation Age of Onset  . Breast cancer Sister   . Breast cancer Mother   . Lung cancer Father   . Breast cancer Maternal Grandmother     Patient Active Problem List   Diagnosis Date Noted  . Abnormal thyroid ultrasound 12/18/2012  . Unspecified vitamin D deficiency 12/18/2012  . Visit for preventive health examination 11/23/2011  . Hand eczema 11/23/2011  . BRCA gene positive   . Hyperglycemia   . H/O: hysterectomy   . Hyperlipidemia   .  Atrial fibrillation 04/28/2011  . OBESITY 06/03/2010  . MASTECTOMY, BILATERAL, HX OF 06/03/2010  . SHOULDER PAIN, RIGHT 06/02/2007  . FASTING HYPERGLYCEMIA 06/02/2007  . HYPERLIPIDEMIA 03/24/2007   Past Medical History  Diagnosis Date  . Hyperlipidemia   . Hyperglycemia   . BRCA gene positive     s/p bilateral mastectomy and hysterectomy.  . Atrial fibrillation 04/28/2011    lone episode following shoulder surgery - normal echo  . Palpitations   . Obesity   . Thyroid disease 12/2012    9 mm left thyroid nodule w/microcalcifications--Dr. Fabian Sharp following    Past Surgical History  Procedure Laterality Date  . Cesarean section      x 3  . Mastectomy      bilateral  . Rotator cuff repair Left   . Abdominal hysterectomy  fall 2009    R-TLH/BSO    ALLERGIES: Review of patient's allergies indicates no known allergies.  Current Outpatient Prescriptions  Medication Sig Dispense Refill  .  aspirin 81 MG tablet Take 81 mg by mouth daily.      . calcium carbonate (OS-CAL) 1250 MG chewable tablet Chew 1 tablet by mouth daily.        . cholecalciferol (VITAMIN D) 1000 UNITS tablet Take 2,000 Units by mouth daily.      . fish oil-omega-3 fatty acids 1000 MG capsule Take 1 g by mouth daily.        . Probiotic Product (ALIGN) 4 MG CAPS Take 1 tablet by mouth daily.       No current facility-administered medications for this visit.     ROS:  Pertinent items are noted in HPI.  SOCIAL HISTORY:  Widow.   Works for pediatric office.   PHYSICAL EXAMINATION:    BP 122/70  Pulse 70  Resp 16  Ht 5\' 7"  (1.702 m)  Wt 208 lb (94.348 kg)  BMI 32.57 kg/m2  LMP 05/07/2008   Wt Readings from Last 3 Encounters:  07/30/13 208 lb (94.348 kg)  03/27/13 211 lb 6.4 oz (95.89 kg)  12/18/12 214 lb (97.07 kg)     Ht Readings from Last 3 Encounters:  07/30/13 5\' 7"  (1.702 m)  03/27/13 5\' 7"  (1.702 m)  12/18/12 5\' 7"  (1.702 m)    General appearance: alert, cooperative and appears stated  age Head: Normocephalic, without obvious abnormality, atraumatic Neck: no adenopathy, supple, symmetrical, trachea midline and thyroid not enlarged, symmetric, no tenderness/mass/nodules Lungs: clear to auscultation bilaterally Breasts:  Absent.  Inspection negative.  No chest wall masses.  No axillary or supraclavicular nodes palpable.  Heart: regular rate and rhythm Abdomen: soft, non-tender; no masses,  no organomegaly Extremities: extremities normal, atraumatic, no cyanosis or edema Skin: Skin color, texture, turgor normal. No rashes or lesions Lymph nodes: Cervical, supraclavicular, and axillary nodes normal. No abnormal inguinal nodes palpated Neurologic: Grossly normal  Pelvic: External genitalia:  no lesions              Urethra:  normal appearing urethra with no masses, tenderness or lesions              Bartholins and Skenes: normal                 Vagina: normal appearing vagina with normal color and discharge, no lesions              Cervix:  absent              Pap and high risk HPV testing done: no.            Bimanual Exam:  Uterus:   absent                                      Adnexa: no masses                                      Rectovaginal: Confirms                                      Anus:  normal sphincter tone, no lesions  ASSESSMENT  Normal gynecologic exam. BRCA2 carrier. Status post bilateral mastectomy. Status post total laparoscopic hysterectomy with bilateral salpingo-oophorectomy.  PLAN  I discussed chest wall and axillary examination  with patient along with reviewing signs and symptoms of metastatic cancer as a way to follow for development of disease. Pap smear and high risk HPV testing not indicated.  Counseled on vit D. Labs with PCP.  Bone density screening due to surgical menopause.  Medications per Epic orders Return annually or prn   An After Visit Summary was printed and given to the patient.

## 2013-08-13 ENCOUNTER — Telehealth: Payer: Self-pay | Admitting: Obstetrics and Gynecology

## 2013-08-13 ENCOUNTER — Telehealth: Payer: Self-pay | Admitting: Internal Medicine

## 2013-08-13 NOTE — Telephone Encounter (Signed)
Return call to patient. AEX 07-30-13 with Dr Edward JollySilva and BMD ordered for surgical menopause. Patient has used [facility] in past for MMG prior to bilateral mastectomy. States this would be an easy location for her to continue to go to for future studies.  Prefers early AM appointment any day but Monday.  Order was entered but not viewable by [facility].  Advised will schedule and call her back once ordered and scheduled.

## 2013-08-13 NOTE — Telephone Encounter (Signed)
Pt states she was suppose to have a repeat thyroid ultrasound in November 2014.  She has not had this done, she needs an order to have completed.  Pt requesting early morning appointment, preferably 8am.

## 2013-08-13 NOTE — Telephone Encounter (Signed)
Pt need order for bone density scan.

## 2013-08-14 ENCOUNTER — Other Ambulatory Visit: Payer: Self-pay | Admitting: Family Medicine

## 2013-08-14 DIAGNOSIS — E041 Nontoxic single thyroid nodule: Secondary | ICD-10-CM

## 2013-08-14 NOTE — Telephone Encounter (Signed)
Order placed in the sytem. 

## 2013-08-17 ENCOUNTER — Ambulatory Visit: Payer: No Typology Code available for payment source | Admitting: Internal Medicine

## 2013-08-21 NOTE — Telephone Encounter (Signed)
Scheduled patient for [facility] Density Testing at [facility] at 08/29/13 at 0800. To inform patient of [address] location as she has previously been to [facility] prior to moving.  Message left to return call to [place] at [phone].

## 2013-08-22 NOTE — Telephone Encounter (Signed)
Spoke with patient and notified regarding appointment. Will follow up prn.

## 2013-08-22 NOTE — Telephone Encounter (Signed)
Patient says she is returning a call to [place]. I only see a message from [place].

## 2013-08-23 ENCOUNTER — Encounter: Payer: Self-pay | Admitting: Internal Medicine

## 2013-09-05 ENCOUNTER — Ambulatory Visit
Admission: RE | Admit: 2013-09-05 | Discharge: 2013-09-05 | Disposition: A | Discharge status: Home / Self Care | Payer: No Typology Code available for payment source | Source: Ambulatory Visit | Attending: Internal Medicine | Admitting: Internal Medicine

## 2013-09-05 DIAGNOSIS — E041 Nontoxic single thyroid nodule: Secondary | ICD-10-CM

## 2013-09-06 ENCOUNTER — Telehealth: Payer: Self-pay | Admitting: Obstetrics and Gynecology

## 2013-09-06 NOTE — Telephone Encounter (Signed)
Pt had a bone density test done on Jan 21st and was wondering if her results were ready. Pt states it is ok to leave a detailed message on this number [phone]

## 2013-09-07 NOTE — Telephone Encounter (Signed)
Paper copy of [facility] Dexa with Dr. Hyacinth MeekerMiller.  Dr. Hyacinth MeekerMiller read Dexa and advised:  Inform Normal, Looks Good! Repeat Bone Density in 5 years.  Patient did not authorize to leave normal results on release of information so Message left to return call to [place] [facility] at [phone].

## 2013-09-10 NOTE — Telephone Encounter (Signed)
Patient returning Tracy's call. °

## 2013-09-11 NOTE — Telephone Encounter (Signed)
LM on pt's VM confirming name that bone density results were good and she can repeat in 5 years per Dr. Hyacinth MeekerMiller. Pt to call back if any questions.

## 2013-09-26 ENCOUNTER — Other Ambulatory Visit: Payer: Self-pay | Admitting: Family Medicine

## 2013-09-26 DIAGNOSIS — R9389 Abnormal findings on diagnostic imaging of other specified body structures: Secondary | ICD-10-CM

## 2013-09-27 ENCOUNTER — Other Ambulatory Visit: Payer: Self-pay | Admitting: Family Medicine

## 2013-09-27 ENCOUNTER — Encounter: Payer: Self-pay | Admitting: Family Medicine

## 2013-09-27 ENCOUNTER — Telehealth: Payer: Self-pay | Admitting: Internal Medicine

## 2013-09-27 DIAGNOSIS — Z0000 Encounter for general adult medical examination without abnormal findings: Secondary | ICD-10-CM

## 2013-09-27 NOTE — Telephone Encounter (Signed)
Pt is trying to reach KevinMisty again.  Pt states she is a little confused about what labs and office visit she is supposed to have.  Pt had labs on 9/14 and needs to know what she needs to come back in for.  Please leave a detailed message on her voicemail (she is at work and may be unable to answer phone) letting her know what exactly she needs done, ie labs, office vist, etc.? Thank you.

## 2013-09-27 NOTE — Telephone Encounter (Signed)
Left a detailed message on the pt's phone reminding her that she is scheduled for lab work on 12/28/13 @ 8:15 am and her yearly physical is on 01/08/14 @ 9:15 am.  Sent a reminder in the mail.

## 2013-12-10 ENCOUNTER — Telehealth: Payer: Self-pay | Admitting: Internal Medicine

## 2013-12-10 NOTE — Telephone Encounter (Signed)
Patient Information:  Caller Name: Delice Bisonara  Phone: [phone]  Patient: Maria Glass, Maria Glass  Gender: Female  DOB: [birth year]  Age: 58 Years  PCP: Berniece AndreasPanosh, Wanda (Family Practice)  Office Follow Up:  Does the office need to follow up with this patient?: Yes  Instructions For The Office: Rx  RN Note:  Patient is calling regarding cough and congestion with greenish phlegm from cough.  Requesting consideration of antibiotic and or decongestant to be called in to [facility] [phone].  States she is unable to leave work do to workflow and will go to UC if necessary.  Desires a call back if this cannot be done.  Symptoms  Reason For Call & Symptoms: congestion, cough, green discharge  Reviewed Health History In EMR: Yes  Reviewed Medications In EMR: Yes  Reviewed Allergies In EMR: Yes  Reviewed Surgeries / Procedures: Yes  Date of Onset of Symptoms: 12/05/2013  Treatments Tried: Delsym  Treatments Tried Worked: No  Guideline(s) Used:  Cough  Disposition Per Guideline:   Home Care  Reason For Disposition Reached:   Cough with cold symptoms (e.g., runny nose, postnasal drip, throat clearing)  Advice Given:  Cough Medicines:  Home Remedy - Honey: This old home remedy has been shown to help decrease coughing at night. The adult dosage is 2 teaspoons (10 ml) at bedtime. Honey should not be given to infants under one year of age.  Coughing Spasms:  Drink warm fluids. Inhale warm mist (Reason: both relax the airway and loosen up the phlegm).  Suck on cough drops or hard candy to coat the irritated throat.  Prevent Dehydration:  Drink adequate liquids.  This will help soothe an irritated or dry throat and loosen up the phlegm.  Call Back If:  Difficulty breathing  Cough lasts more than 3 weeks  You become worse.  Patient Refused Recommendation:  Patient Requests Prescription  requesting antibiotic and or decongestant

## 2013-12-10 NOTE — Telephone Encounter (Signed)
Called and spoke to the pt.  Due to scheduling conflicts she will go to a urgent care after work.  She does not leave until six pm and does not go in until 9am.  Apologized for not being able to see her when it was convenient.

## 2013-12-25 ENCOUNTER — Encounter: Payer: Self-pay | Admitting: Internal Medicine

## 2013-12-28 ENCOUNTER — Other Ambulatory Visit (INDEPENDENT_AMBULATORY_CARE_PROVIDER_SITE_OTHER): Payer: No Typology Code available for payment source

## 2013-12-28 DIAGNOSIS — Z0000 Encounter for general adult medical examination without abnormal findings: Secondary | ICD-10-CM

## 2013-12-28 DIAGNOSIS — R946 Abnormal results of thyroid function studies: Secondary | ICD-10-CM

## 2013-12-28 DIAGNOSIS — R9389 Abnormal findings on diagnostic imaging of other specified body structures: Secondary | ICD-10-CM

## 2013-12-28 LAB — TSH: TSH: 0.79 u[IU]/mL (ref 0.35–4.50)

## 2013-12-28 LAB — HEPATIC FUNCTION PANEL
ALT: 19 U/L (ref 0–35)
AST: 18 U/L (ref 0–37)
Albumin: 3.5 g/dL (ref 3.5–5.2)
Alkaline Phosphatase: 67 U/L (ref 39–117)
BILIRUBIN DIRECT: 0 mg/dL (ref 0.0–0.3)
BILIRUBIN TOTAL: 0.7 mg/dL (ref 0.2–1.2)
Total Protein: 6.7 g/dL (ref 6.0–8.3)

## 2013-12-28 LAB — CBC WITH DIFFERENTIAL/PLATELET
BASOS ABS: 0 10*3/uL (ref 0.0–0.1)
Basophils Relative: 0.8 % (ref 0.0–3.0)
EOS ABS: 0.1 10*3/uL (ref 0.0–0.7)
Eosinophils Relative: 2.3 % (ref 0.0–5.0)
HCT: 37.2 % (ref 36.0–46.0)
Hemoglobin: 12.3 g/dL (ref 12.0–15.0)
LYMPHS PCT: 27.5 % (ref 12.0–46.0)
Lymphs Abs: 1.3 10*3/uL (ref 0.7–4.0)
MCHC: 33 g/dL (ref 30.0–36.0)
MCV: 91.4 fl (ref 78.0–100.0)
Monocytes Absolute: 0.5 10*3/uL (ref 0.1–1.0)
Monocytes Relative: 9.6 % (ref 3.0–12.0)
NEUTROS PCT: 59.8 % (ref 43.0–77.0)
Neutro Abs: 2.8 10*3/uL (ref 1.4–7.7)
PLATELETS: 177 10*3/uL (ref 150.0–400.0)
RBC: 4.07 Mil/uL (ref 3.87–5.11)
RDW: 14 % (ref 11.5–15.5)
WBC: 4.7 10*3/uL (ref 4.0–10.5)

## 2013-12-28 LAB — BASIC METABOLIC PANEL
BUN: 22 mg/dL (ref 6–23)
CHLORIDE: 105 meq/L (ref 96–112)
CO2: 28 mEq/L (ref 19–32)
Calcium: 9.4 mg/dL (ref 8.4–10.5)
Creatinine, Ser: 0.6 mg/dL (ref 0.4–1.2)
GFR: 105.39 mL/min (ref >60.00)
Glucose, Bld: 97 mg/dL (ref 70–99)
POTASSIUM: 4.2 meq/L (ref 3.5–5.1)
SODIUM: 138 meq/L (ref 135–145)

## 2013-12-28 LAB — T4, FREE: FREE T4: 0.95 ng/dL (ref 0.60–1.60)

## 2013-12-28 LAB — LIPID PANEL
CHOL/HDL RATIO: 4
Cholesterol: 214 mg/dL — ABNORMAL HIGH (ref 0–200)
HDL: 53.3 mg/dL (ref >39.00)
LDL CALC: 147 mg/dL — AB (ref 0–99)
TRIGLYCERIDES: 68 mg/dL (ref 0.0–149.0)
VLDL: 13.6 mg/dL (ref 0.0–40.0)

## 2013-12-28 LAB — T3, FREE: T3, Free: 2.9 pg/mL (ref 2.3–4.2)

## 2014-01-07 ENCOUNTER — Encounter: Payer: Self-pay | Admitting: Internal Medicine

## 2014-01-08 ENCOUNTER — Ambulatory Visit (INDEPENDENT_AMBULATORY_CARE_PROVIDER_SITE_OTHER): Payer: No Typology Code available for payment source | Admitting: Internal Medicine

## 2014-01-08 ENCOUNTER — Encounter: Payer: Self-pay | Admitting: Internal Medicine

## 2014-01-08 VITALS — BP 126/74 | HR 61 | Temp 98.0°F | Ht 67.0 in | Wt 204.0 lb

## 2014-01-08 DIAGNOSIS — R946 Abnormal results of thyroid function studies: Secondary | ICD-10-CM

## 2014-01-08 DIAGNOSIS — Z0000 Encounter for general adult medical examination without abnormal findings: Secondary | ICD-10-CM

## 2014-01-08 DIAGNOSIS — Z1509 Genetic susceptibility to other malignant neoplasm: Secondary | ICD-10-CM

## 2014-01-08 DIAGNOSIS — Z1502 Genetic susceptibility to malignant neoplasm of ovary: Secondary | ICD-10-CM

## 2014-01-08 DIAGNOSIS — Z1501 Genetic susceptibility to malignant neoplasm of breast: Secondary | ICD-10-CM

## 2014-01-08 DIAGNOSIS — R9389 Abnormal findings on diagnostic imaging of other specified body structures: Secondary | ICD-10-CM

## 2014-01-08 DIAGNOSIS — Z8679 Personal history of other diseases of the circulatory system: Secondary | ICD-10-CM | POA: Insufficient documentation

## 2014-01-08 DIAGNOSIS — E785 Hyperlipidemia, unspecified: Secondary | ICD-10-CM

## 2014-01-08 NOTE — Assessment & Plan Note (Signed)
No recurrance

## 2014-01-08 NOTE — Progress Notes (Signed)
Chief Complaint  Patient presents with  . Annual Exam    HPI: Patient comes in today for Preventive Health Care visit  No major change in health status since last visit . Taking vit d  LIFESTYLE:  Exercise:   Walking  Tobacco/ETS:no Alcohol: no  Sugar beverages: mostly water  Sleep: good  Drug use: no Bone density:  ?  Gyne dexa.   Colonoscopy:  utd   March this year    Health Maintenance  Topic Date Due  . Influenza Vaccine  03/09/2014  . Pap Smear  05/09/2016  . Tetanus/tdap  05/09/2016  . Colonoscopy  11/08/2023   Health Maintenance Review   ROS:  GEN/ HEENT: No fever, significant weight changes sweats headaches vision problems hearing changes, CV/ PULM; No chest pain shortness of breath cough, syncope,edema  change in exercise tolerance. GI /GU: No adominal pain, vomiting, change in bowel habits. No blood in the stool. No significant GU symptoms. SKIN/HEME: ,no acute skin rashes suspicious lesions or bleeding. No lymphadenopathy, nodules, masses.  NEURO/ PSYCH:  No neurologic signs such as weakness numbness. No depression anxiety. IMM/ Allergy: No unusual infections.  Allergy .   REST of 12 system review negative except as per HPI   Past Medical History  Diagnosis Date  . Hyperlipidemia   . Hyperglycemia   . BRCA gene positive     s/p bilateral mastectomy and hysterectomy.  . Atrial fibrillation 04/28/2011    lone episode following shoulder surgery - normal echo  . Palpitations   . Obesity   . Thyroid disease 12/2012    9 mm left thyroid nodule w/microcalcifications--Dr. Regis Bill following    Family History  Problem Relation Age of Onset  . Breast cancer Sister   . Breast cancer Mother   . Lung cancer Father   . Breast cancer Maternal Grandmother     History   Social History  . Marital Status: Widowed    Spouse Name: [name]    Number of Children: [name]  . Years of Education: [name]   Occupational History  . works at [employer]    Social  History Main Topics  . Smoking status: Never Smoker   . Smokeless tobacco: Never Used  . Alcohol Use: No  . Drug Use: No  . Sexual Activity: No     Comment: R-TLH/BSO   Other Topics Concern  . None   Social History Narrative   Widowed October 2009   Regular Exercise-yes   Plans on bm txp donor to her sis in the spring at [facility]   Husband died from septicemia from bowel?   Works at [employer]   Has now sold her house and living with her son.  They buy a condo is trying to eat healthier   Sleep 6-7 hours    Pet dog           Outpatient Encounter Prescriptions as of 01/08/2014  Medication Sig  . aspirin 81 MG tablet Take 81 mg by mouth daily.  . Calcium Carbonate-Vitamin D (CALTRATE 600+D) 600-400 MG-UNIT per tablet Take 1 tablet by mouth daily.  . Cholecalciferol (VITAMIN D3) 2000 UNITS TABS Take by mouth. Taking 1 in the morning and 1 at night  . fish oil-omega-3 fatty acids 1000 MG capsule Taking 1-2 daily  . FLUTICASONE PROPIONATE, NASAL, NA Place into the nose. 1 spray in each nostril daily  . GLUCOSAMINE-CHONDROITIN PO Take by mouth.  . Multiple Vitamins-Minerals (MULTIVITAMIN PO) Take  by mouth.  . Probiotic Product (ALIGN) 4 MG CAPS Take 1 tablet by mouth daily.  . [DISCONTINUED] calcium carbonate (OS-CAL) 1250 MG chewable tablet Chew 1 tablet by mouth daily.    . [DISCONTINUED] cholecalciferol (VITAMIN D) 1000 UNITS tablet Take 2,000 Units by mouth daily.    EXAM:  BP 126/74  Pulse 61  Temp(Src) 98 F (36.7 C) (Oral)  Ht 5' 7"  (1.702 m)  Wt 204 lb (92.534 kg)  BMI 31.94 kg/m2  SpO2 98%  LMP 05/07/2008  Body mass index is 31.94 kg/(m^2).  Physical Exam: Vital signs reviewed VOJ:JKKX is a well-developed well-nourished alert cooperative    who appearsr stated age in no acute distress.  HEENT: normocephalic atraumatic , Eyes: PERRL EOM's full, conjunctiva clear, Nares: paten,t no deformity discharge or tenderness., Ears: no deformity EAC's clear TMs  with normal landmarks. Mouth: clear OP, no lesions, edema.  Moist mucous membranes. Dentition in adequate repair. NECK: supple without masses, thyromegaly or bruits. CHEST/PULM:  Clear to auscultation and percussion breath sounds equal no wheeze , rales or rhonchi. No chest wall deformities or tenderness. Breast absent surgically  CV: PMI is nondisplaced, S1 S2 no gallops, murmurs, rubs. Peripheral pulses are full without delay.No JVD .  ABDOMEN: Bowel sounds normal nontender  No guard or rebound, no hepato splenomegal no CVA tenderness.  No hernia. Extremtities:  No clubbing cyanosis or edema, no acute joint swelling or redness no focal atrophy VV no ulcers  NEURO:  Oriented x3, cranial nerves 3-12 appear to be intact, no obvious focal weakness,gait within normal limits no abnormal reflexes or asymmetrical SKIN: No acute rashes normal turgor, color, no bruising or petechiae. PSYCH: Oriented, good eye contact, no obvious depression anxiety, cognition and judgment appear normal. LN: no cervical axillary inguinal adenopathy  Lab Results  Component Value Date   WBC 4.7 12/28/2013   HGB 12.3 12/28/2013   HCT 37.2 12/28/2013   PLT 177.0 12/28/2013   GLUCOSE 97 12/28/2013   CHOL 214* 12/28/2013   TRIG 68.0 12/28/2013   HDL 53.30 12/28/2013   LDLDIRECT 151.3 04/18/2013   LDLCALC 147* 12/28/2013   ALT 19 12/28/2013   AST 18 12/28/2013   NA 138 12/28/2013   K 4.2 12/28/2013   CL 105 12/28/2013   CREATININE 0.6 12/28/2013   BUN 22 12/28/2013   CO2 28 12/28/2013   TSH 0.79 12/28/2013   HGBA1C 5.7 04/18/2013   Wt Readings from Last 3 Encounters:  01/08/14 204 lb (92.534 kg)  07/30/13 208 lb (94.348 kg)  03/27/13 211 lb 6.4 oz (95.89 kg)    ASSESSMENT AND PLAN:  Discussed the following assessment and plan:  Visit for preventive health examination - utd   BRCA gene positive - mastectomy and hysterectomy   HYPERLIPIDEMIA - disc lsi   Abnormal thyroid ultrasound found on life screen - insg nodules on  [place] and  nl funciton follow exam  Counseled regarding healthy nutrition, exercise, sleep, injury prevention, calcium vit d and healthy weight .  Patient Care Team: Burnis Medin, MD as PCP - General Augustin Schooling, MD (Orthopedic Surgery) Juanita Craver, MD as Attending Physician (Gastroenterology) Patient Instructions   Continue lifestyle intervention healthy eating and exercise . 150 minutes of exercise weeks  ,  Lose weight  To healthy levels. Avoid trans fats and processed foods;  Increase fresh fruits and veges to 5 servings per day. And avoid sweet beverages  Including tea and juice.  Check into shingles vaccine ( Zostavax) reimbursement or  cost to you  and can return at any time if call ahead for injection. thryoid exam is ok today ;follow exam yearly . consdier otc compreesion socks 15- 20 mm     Standley Brooking. Zakya Halabi M.D.    Pre visit review using our clinic review tool, if applicable. No additional management support is needed unless otherwise documented below in the visit note.

## 2014-01-08 NOTE — Patient Instructions (Signed)
Continue lifestyle intervention healthy eating and exercise . 150 minutes of exercise weeks  ,  Lose weight  To healthy levels. Avoid trans fats and processed foods;  Increase fresh fruits and veges to 5 servings per day. And avoid sweet beverages  Including tea and juice.  Check into shingles vaccine ( Zostavax) reimbursement or cost to you  and can return at any time if call ahead for injection. thryoid exam is ok today ;follow exam yearly . consdier otc compreesion socks 15- 20 mm

## 2014-04-17 ENCOUNTER — Encounter: Payer: Self-pay | Admitting: Internal Medicine

## 2014-04-17 ENCOUNTER — Ambulatory Visit (INDEPENDENT_AMBULATORY_CARE_PROVIDER_SITE_OTHER): Payer: No Typology Code available for payment source | Admitting: Internal Medicine

## 2014-04-17 VITALS — BP 146/84 | Temp 98.1°F | Ht 67.0 in | Wt 204.8 lb

## 2014-04-17 DIAGNOSIS — T2200XS Burn of unspecified degree of shoulder and upper limb, except wrist and hand, unspecified site, sequela: Secondary | ICD-10-CM

## 2014-04-17 DIAGNOSIS — T07XXXA Unspecified multiple injuries, initial encounter: Secondary | ICD-10-CM

## 2014-04-17 DIAGNOSIS — IMO0002 Reserved for concepts with insufficient information to code with codable children: Secondary | ICD-10-CM
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
## 2014-04-20 DIAGNOSIS — T2200XA Burn of unspecified degree of shoulder and upper limb, except wrist and hand, unspecified site, initial encounter: Secondary | ICD-10-CM | POA: Insufficient documentation

## 2014-04-20 DIAGNOSIS — T07XXXA Unspecified multiple injuries, initial encounter: Secondary | ICD-10-CM | POA: Insufficient documentation

## 2014-05-10 ENCOUNTER — Encounter: Payer: Self-pay | Admitting: Internal Medicine

## 2014-05-10 ENCOUNTER — Ambulatory Visit (INDEPENDENT_AMBULATORY_CARE_PROVIDER_SITE_OTHER): Payer: No Typology Code available for payment source | Admitting: Internal Medicine

## 2014-05-10 VITALS — BP 120/72 | Temp 98.2°F | Ht 67.0 in | Wt 203.8 lb

## 2014-05-10 DIAGNOSIS — T148 Other injury of unspecified body region: Secondary | ICD-10-CM

## 2014-05-10 DIAGNOSIS — T07XXXA Unspecified multiple injuries, initial encounter: Secondary | ICD-10-CM

## 2014-05-10 DIAGNOSIS — M542 Cervicalgia: Secondary | ICD-10-CM

## 2014-05-10 DIAGNOSIS — H9202 Otalgia, left ear: Secondary | ICD-10-CM
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
## 2014-05-10 NOTE — Patient Instructions (Signed)
Take  2 aleve (220 mg /tab) twice a day for about a week and see if helps the residular soft tissue discomfort .   I agree with follow up with  Ortho .   Fu as needed .

## 2014-05-15 ENCOUNTER — Telehealth: Payer: Self-pay | Admitting: Obstetrics and Gynecology

## 2014-05-15 NOTE — Telephone Encounter (Signed)
Call to pt to let her know we have canceled her appt and we need to reschedule. lmtcb so that she can reschedule.

## 2014-06-10 ENCOUNTER — Encounter: Payer: Self-pay | Admitting: Internal Medicine

## 2014-06-13 ENCOUNTER — Encounter: Payer: Self-pay | Admitting: Internal Medicine

## 2014-06-14 ENCOUNTER — Other Ambulatory Visit: Payer: Self-pay | Admitting: Family Medicine

## 2014-06-14 DIAGNOSIS — E041 Nontoxic single thyroid nodule: Secondary | ICD-10-CM

## 2014-06-14 NOTE — Telephone Encounter (Signed)
Agree to pateint request  For repeat ultrasound before end of year.

## 2014-06-27 ENCOUNTER — Ambulatory Visit
Admission: RE | Admit: 2014-06-27 | Discharge: 2014-06-27 | Disposition: A | Discharge status: Home / Self Care | Payer: No Typology Code available for payment source | Source: Ambulatory Visit | Attending: Internal Medicine | Admitting: Internal Medicine

## 2014-06-27 DIAGNOSIS — E041 Nontoxic single thyroid nodule: Secondary | ICD-10-CM

## 2014-07-08 ENCOUNTER — Telehealth: Payer: Self-pay | Admitting: Internal Medicine

## 2014-07-08 NOTE — Telephone Encounter (Signed)
Please schedule. Thanks! 

## 2014-07-08 NOTE — Telephone Encounter (Signed)
Pt states she has a pain in her  Left side neck and hurts when she pushes on it . Pt has had for 2 wks.   Pt would like to see you this week. pls advise

## 2014-07-08 NOTE — Telephone Encounter (Signed)
Attempted to call pt several times and kept getting a hang up

## 2014-07-09 NOTE — Telephone Encounter (Signed)
Pt has been scheduled.  °

## 2014-07-10 ENCOUNTER — Encounter: Payer: Self-pay | Admitting: Internal Medicine

## 2014-07-10 ENCOUNTER — Ambulatory Visit (INDEPENDENT_AMBULATORY_CARE_PROVIDER_SITE_OTHER): Payer: BC Managed Care – PPO | Admitting: Internal Medicine

## 2014-07-10 VITALS — BP 130/70 | Temp 98.0°F | Ht 67.0 in | Wt 203.9 lb

## 2014-07-10 DIAGNOSIS — M542 Cervicalgia: Secondary | ICD-10-CM

## 2014-07-10 DIAGNOSIS — Z8619 Personal history of other infectious and parasitic diseases: Secondary | ICD-10-CM

## 2014-07-10 DIAGNOSIS — Z8709 Personal history of other diseases of the respiratory system: Secondary | ICD-10-CM

## 2014-07-10 MED ORDER — CEPHALEXIN 500 MG PO CAPS: 500.0000 mg | ORAL_CAPSULE | Freq: Three times a day (TID) | ORAL | Status: DC

## 2014-07-10 NOTE — Patient Instructions (Addendum)
Empiric antibiotic broader spectrum because you just had strep throat .  If not better  After this rx  contact us for referral to ENT  To check.  If worse  or other concerns  Contact us .

## 2014-07-10 NOTE — Progress Notes (Signed)
Pre visit review using our clinic review tool, if applicable. No additional management support is needed unless otherwise documented below in the visit note.  Chief Complaint  Patient presents with  . Neck Pain    HPI: Patient Maria Glass  comes in today for SDA for  New  problem evaluation.  Saw dr Delilah Shan for past neck pain etodalac for  Left neck.  mz different than  This new problem   Onset about 4 weeks ago  Had strep in mid October  Pos test.  rx with amoxiciliin .  10 days .  And repeated  With this and negative .  Was better in a few day.  Initially ? If having Fevers  No but ha end of day.  Asa relieved.sx  No cold  Ongoing left paratracheal tenderness  Feels more like  Glands  and not throat.  No Pain with swallowing  Knows there and tender to touch mildly  Denies chronic st.   ROS: See pertinent positives and negatives per HPI. Had thyroid US recently  MNG none large enough to bx  Follow   Past Medical History  Diagnosis Date  . Hyperlipidemia   . Hyperglycemia   . BRCA gene positive     s/p bilateral mastectomy and hysterectomy.  . Atrial fibrillation 04/28/2011    lone episode following shoulder surgery - normal echo  . Palpitations   . Obesity   . Thyroid disease 12/2012    9 mm left thyroid nodule w/microcalcifications--Dr. Regis Bill following    Family History  Problem Relation Age of Onset  . Breast cancer Sister   . Breast cancer Mother   . Lung cancer Father   . Breast cancer Maternal Grandmother     History   Social History  . Marital Status: Widowed    Spouse Name: [name]    Number of Children: [name]  . Years of Education: [name]   Occupational History  . works at [employer]    Social History Main Topics  . Smoking status: Never Smoker   . Smokeless tobacco: Never Used  . Alcohol Use: No  . Drug Use: No  . Sexual Activity: No     Comment: R-TLH/BSO   Other Topics Concern  . None   Social History Narrative   Widowed October 2009     Regular Exercise-yes   Plans on bm txp donor to her sis in the spring at [place]   Husband died from septicemia from bowel?   Works at [employer]   Has now sold her house and living with her son.  They buy a condo is trying to eat healthier   Sleep 6-7 hours    Pet dog           Outpatient Encounter Prescriptions as of 07/10/2014  Medication Sig  . Ascorbic Acid (VITAMIN C PO) Take by mouth.  Marland Kitchen aspirin 81 MG tablet Take 81 mg by mouth daily.  . Calcium Carbonate-Vitamin D (CALTRATE 600+D) 600-400 MG-UNIT per tablet Take 1 tablet by mouth daily.  . Cholecalciferol (VITAMIN D3) 2000 UNITS TABS Take by mouth. Taking 1 in the morning and 1 at night  . etodolac (LODINE) 400 MG tablet Take 400 mg by mouth 2 (two) times daily.   . fish oil-omega-3 fatty acids 1000 MG capsule Taking 1-2 daily  . FLUTICASONE PROPIONATE, NASAL, NA Place into the nose. 1 spray in each nostril daily  . GLUCOSAMINE-CHONDROITIN PO Take by mouth.  Marland Kitchen  Multiple Vitamins-Minerals (MULTIVITAMIN PO) Take by mouth.  . Probiotic Product (ALIGN) 4 MG CAPS Take 1 tablet by mouth daily.  . cephALEXin (KEFLEX) 500 MG capsule Take 1 capsule (500 mg total) by mouth 3 (three) times daily.  . [DISCONTINUED] HYDROcodone-acetaminophen (NORCO/VICODIN) 5-325 MG per tablet Take 1 tablet by mouth every 4 (four) hours as needed for moderate pain.    EXAM:  BP 130/70 mmHg  Temp(Src) 98 F (36.7 C) (Oral)  Ht 5' 7"  (1.702 m)  Wt 203 lb 14.4 oz (92.488 kg)  BMI 31.93 kg/m2  LMP 05/07/2008  Body mass index is 31.93 kg/(m^2).  GENERAL: vitals reviewed and listed above, alert, oriented, appears well hydrated and in no acute distress HEENT: atraumatic, conjunctiva  clear, no obvious abnormalities on inspection of external nose and ears OP : no lesion edema or exudate  Tonsil 1 + left slightly more than right no exudate midl redness no edema  NECK: no obvious masses on inspection thyrod palpable not tender  Left mid  paratracheal area  Mild tenderness  No mass but thicker than the right side  ? If ln  Neg pc or [place] nodes  PSYCH: pleasant and cooperative, no obvious depression or anxiety  ASSESSMENT AND PLAN:  Discussed the following assessment and plan:  Neck pain on left side  Hx of streptococcal pharyngitis Uncertain cause and exam is not real impressive however persistent and her tonsils are little bit prominent left more than right. Because she just had treatment for documented streptococcal pharyngitis before the onset of the symptoms can try empiric broader spectrum for 7-10 days Keflex if not improved she can contact us and we will refer to ENT for evaluation. Patient agrees if worsening contact us earlier. -Patient advised to return or notify health care team  if symptoms worsen ,persist or new concerns arise.  Patient Instructions  Empiric antibiotic broader spectrum because you just had strep throat .  If not better  After this rx  contact us for referral to ENT  To check.  If worse  or other concerns  Contact us .     Standley Brooking. Panosh M.D.

## 2014-08-15 ENCOUNTER — Ambulatory Visit: Payer: No Typology Code available for payment source | Admitting: Obstetrics and Gynecology

## 2014-09-18 ENCOUNTER — Encounter: Payer: Self-pay | Admitting: Internal Medicine

## 2014-09-18 ENCOUNTER — Ambulatory Visit (INDEPENDENT_AMBULATORY_CARE_PROVIDER_SITE_OTHER): Payer: BLUE CROSS/BLUE SHIELD | Admitting: Internal Medicine

## 2014-09-18 VITALS — BP 134/70 | Temp 97.7°F | Ht 66.5 in | Wt 206.6 lb

## 2014-09-18 DIAGNOSIS — S161XXA Strain of muscle, fascia and tendon at neck level, initial encounter: Secondary | ICD-10-CM

## 2014-09-18 NOTE — Patient Instructions (Signed)
Mild neck strain.    At this time  Ok to take the etodalac  As needed.  Ice packs  As needed.  Walking activity ok  hold  Off on the the weights   Until better . Can take weeks to totally recover from strain. Activity as tolerated upper body .   Cervical Sprain A cervical sprain is an injury in the neck in which the strong, fibrous tissues (ligaments) that connect your neck bones stretch or tear. Cervical sprains can range from mild to severe. Severe cervical sprains can cause the neck vertebrae to be unstable. This can lead to damage of the spinal cord and can result in serious nervous system problems. The amount of time it takes for a cervical sprain to get better depends on the cause and extent of the injury. Most cervical sprains heal in 1 to 3 weeks. CAUSES  Severe cervical sprains may be caused by:   Contact sport injuries (such as from football, rugby, wrestling, hockey, auto racing, gymnastics, diving, martial arts, or boxing).   Motor vehicle collisions.   Whiplash injuries. This is an injury from a sudden forward and backward whipping movement of the head and neck.  Falls.  Mild cervical sprains may be caused by:   Being in an awkward position, such as while cradling a telephone between your ear and shoulder.   Sitting in a chair that does not offer proper support.   Working at a poorly Marketing executivedesigned computer station.   Looking up or down for long periods of time.  SYMPTOMS   Pain, soreness, stiffness, or a burning sensation in the front, back, or sides of the neck. This discomfort may develop immediately after the injury or slowly, 24 hours or more after the injury.   Pain or tenderness directly in the middle of the back of the neck.   Shoulder or upper back pain.   Limited ability to move the neck.   Headache.   Dizziness.   Weakness, numbness, or tingling in the hands or arms.   Muscle spasms.   Difficulty swallowing or chewing.   Tenderness and  swelling of the neck.  DIAGNOSIS  Most of the time your health care provider can diagnose a cervical sprain by taking your history and doing a physical exam. Your health care provider will ask about previous neck injuries and any known neck problems, such as arthritis in the neck. X-rays may be taken to find out if there are any other problems, such as with the bones of the neck. Other tests, such as a CT scan or MRI, may also be needed.  TREATMENT  Treatment depends on the severity of the cervical sprain. Mild sprains can be treated with rest, keeping the neck in place (immobilization), and pain medicines. Severe cervical sprains are immediately immobilized. Further treatment is done to help with pain, muscle spasms, and other symptoms and may include:  Medicines, such as pain relievers, numbing medicines, or muscle relaxants.   Physical therapy. This may involve stretching exercises, strengthening exercises, and posture training. Exercises and improved posture can help stabilize the neck, strengthen muscles, and help stop symptoms from returning.  HOME CARE INSTRUCTIONS   Put ice on the injured area.   Put ice in a plastic bag.   Place a towel between your skin and the bag.   Leave the ice on for 15-20 minutes, 3-4 times a day.   If your injury was severe, you may have been given a cervical collar to  wear. A cervical collar is a two-piece collar designed to keep your neck from moving while it heals.  Do not remove the collar unless instructed by your health care provider.  If you have long hair, keep it outside of the collar.  Ask your health care provider before making any adjustments to your collar. Minor adjustments may be required over time to improve comfort and reduce pressure on your chin or on the back of your head.  Ifyou are allowed to remove the collar for cleaning or bathing, follow your health care provider's instructions on how to do so safely.  Keep your collar  clean by wiping it with mild soap and water and drying it completely. If the collar you have been given includes removable pads, remove them every 1-2 days and hand wash them with soap and water. Allow them to air dry. They should be completely dry before you wear them in the collar.  If you are allowed to remove the collar for cleaning and bathing, wash and dry the skin of your neck. Check your skin for irritation or sores. If you see any, tell your health care provider.  Do not drive while wearing the collar.   Only take over-the-counter or prescription medicines for pain, discomfort, or fever as directed by your health care provider.   Keep all follow-up appointments as directed by your health care provider.   Keep all physical therapy appointments as directed by your health care provider.   Make any needed adjustments to your workstation to promote good posture.   Avoid positions and activities that make your symptoms worse.   Warm up and stretch before being active to help prevent problems.  SEEK MEDICAL CARE IF:   Your pain is not controlled with medicine.   You are unable to decrease your pain medicine over time as planned.   Your activity level is not improving as expected.  SEEK IMMEDIATE MEDICAL CARE IF:   You develop any bleeding.  You develop stomach upset.  You have signs of an allergic reaction to your medicine.   Your symptoms get worse.   You develop new, unexplained symptoms.   You have numbness, tingling, weakness, or paralysis in any part of your body.  MAKE SURE YOU:   Understand these instructions.  Will watch your condition.  Will get help right away if you are not doing well or get worse. Document Released: 05/23/2007 Document Revised: 07/31/2013 Document Reviewed: 01/31/2013 [facility] Patient Information 2015 [place], [place]. This information is not intended to replace advice given to you by your health care provider. Make sure you  discuss any questions you have with your health care provider.
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
## 2014-10-10 ENCOUNTER — Other Ambulatory Visit: Payer: Self-pay | Admitting: Family Medicine

## 2014-10-10 ENCOUNTER — Ambulatory Visit (INDEPENDENT_AMBULATORY_CARE_PROVIDER_SITE_OTHER): Payer: BLUE CROSS/BLUE SHIELD | Admitting: Internal Medicine

## 2014-10-10 ENCOUNTER — Encounter: Payer: Self-pay | Admitting: Internal Medicine

## 2014-10-10 VITALS — BP 126/74 | Temp 97.9°F | Ht 67.0 in | Wt 208.2 lb

## 2014-10-10 DIAGNOSIS — E559 Vitamin D deficiency, unspecified: Secondary | ICD-10-CM

## 2014-10-10 DIAGNOSIS — M542 Cervicalgia: Secondary | ICD-10-CM

## 2014-10-10 DIAGNOSIS — Z0000 Encounter for general adult medical examination without abnormal findings: Secondary | ICD-10-CM

## 2014-10-10 NOTE — Progress Notes (Signed)
Pre visit review using our clinic review tool, if applicable. No additional management support is needed unless otherwise documented below in the visit note. 
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
## 2014-10-10 NOTE — Patient Instructions (Signed)
Continue  Add exercises back 3-5 x per week.  Esp with neck  And posture   if the pain is  persistent or progressive we can get ENT to see you .  But  Exam looks  Good today.  Ok to add vitamin D  To your check  Up labs  .

## 2014-10-25 ENCOUNTER — Ambulatory Visit (INDEPENDENT_AMBULATORY_CARE_PROVIDER_SITE_OTHER): Payer: BLUE CROSS/BLUE SHIELD | Admitting: Obstetrics and Gynecology

## 2014-10-25 ENCOUNTER — Encounter: Payer: Self-pay | Admitting: Obstetrics and Gynecology

## 2014-10-25 VITALS — BP 118/68 | HR 80 | Resp 16 | Ht 66.5 in | Wt 206.6 lb

## 2014-10-25 DIAGNOSIS — Z01419 Encounter for gynecological examination (general) (routine) without abnormal findings: Secondary | ICD-10-CM

## 2014-10-25 DIAGNOSIS — Z0000 Encounter for general adult medical examination without abnormal findings: Secondary | ICD-10-CM

## 2014-10-25 LAB — POCT URINALYSIS DIPSTICK
Bilirubin, UA: NEGATIVE
GLUCOSE UA: NEGATIVE
Ketones, UA: NEGATIVE
Leukocytes, UA: NEGATIVE
NITRITE UA: NEGATIVE
PH UA: 5
Protein, UA: NEGATIVE
RBC UA: NEGATIVE
UROBILINOGEN UA: NEGATIVE

## 2014-10-25 NOTE — Patient Instructions (Signed)
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
## 2014-10-28 ENCOUNTER — Encounter: Payer: Self-pay | Admitting: Internal Medicine

## 2014-10-28 DIAGNOSIS — M542 Cervicalgia: Secondary | ICD-10-CM

## 2014-10-28 DIAGNOSIS — R6884 Jaw pain: Secondary | ICD-10-CM

## 2014-11-07 ENCOUNTER — Ambulatory Visit: Payer: BLUE CROSS/BLUE SHIELD | Admitting: Family Medicine

## 2015-01-22 ENCOUNTER — Other Ambulatory Visit (INDEPENDENT_AMBULATORY_CARE_PROVIDER_SITE_OTHER): Payer: BLUE CROSS/BLUE SHIELD

## 2015-01-22 DIAGNOSIS — Z0000 Encounter for general adult medical examination without abnormal findings: Secondary | ICD-10-CM

## 2015-01-22 DIAGNOSIS — E559 Vitamin D deficiency, unspecified: Secondary | ICD-10-CM | POA: Diagnosis not present

## 2015-01-22 LAB — LIPID PANEL
CHOL/HDL RATIO: 4
Cholesterol: 210 mg/dL — ABNORMAL HIGH (ref 0–200)
HDL: 54.2 mg/dL (ref >39.00)
LDL CALC: 145 mg/dL — AB (ref 0–99)
NONHDL: 155.8
Triglycerides: 55 mg/dL (ref 0.0–149.0)
VLDL: 11 mg/dL (ref 0.0–40.0)

## 2015-01-22 LAB — BASIC METABOLIC PANEL
BUN: 26 mg/dL — ABNORMAL HIGH (ref 6–23)
CALCIUM: 9.5 mg/dL (ref 8.4–10.5)
CHLORIDE: 105 meq/L (ref 96–112)
CO2: 29 mEq/L (ref 19–32)
Creatinine, Ser: 0.76 mg/dL (ref 0.40–1.20)
GFR: 83.01 mL/min (ref >60.00)
Glucose, Bld: 105 mg/dL — ABNORMAL HIGH (ref 70–99)
Potassium: 5 mEq/L (ref 3.5–5.1)
Sodium: 138 mEq/L (ref 135–145)

## 2015-01-22 LAB — HEPATIC FUNCTION PANEL
ALK PHOS: 79 U/L (ref 39–117)
ALT: 16 U/L (ref 0–35)
AST: 18 U/L (ref 0–37)
Albumin: 3.9 g/dL (ref 3.5–5.2)
BILIRUBIN TOTAL: 0.3 mg/dL (ref 0.2–1.2)
Bilirubin, Direct: 0 mg/dL (ref 0.0–0.3)
Total Protein: 6.7 g/dL (ref 6.0–8.3)

## 2015-01-22 LAB — CBC WITH DIFFERENTIAL/PLATELET
BASOS ABS: 0 10*3/uL (ref 0.0–0.1)
Basophils Relative: 0.7 % (ref 0.0–3.0)
EOS ABS: 0.1 10*3/uL (ref 0.0–0.7)
Eosinophils Relative: 2.9 % (ref 0.0–5.0)
HEMATOCRIT: 39.5 % (ref 36.0–46.0)
Hemoglobin: 13.1 g/dL (ref 12.0–15.0)
LYMPHS ABS: 1.3 10*3/uL (ref 0.7–4.0)
Lymphocytes Relative: 28 % (ref 12.0–46.0)
MCHC: 33 g/dL (ref 30.0–36.0)
MCV: 90.9 fl (ref 78.0–100.0)
MONO ABS: 0.5 10*3/uL (ref 0.1–1.0)
Monocytes Relative: 11.2 % (ref 3.0–12.0)
Neutro Abs: 2.8 10*3/uL (ref 1.4–7.7)
Neutrophils Relative %: 57.2 % (ref 43.0–77.0)
Platelets: 173 10*3/uL (ref 150.0–400.0)
RBC: 4.35 Mil/uL (ref 3.87–5.11)
RDW: 14.1 % (ref 11.5–15.5)
WBC: 4.8 10*3/uL (ref 4.0–10.5)

## 2015-01-22 LAB — VITAMIN D 25 HYDROXY (VIT D DEFICIENCY, FRACTURES): VITD: 48.43 ng/mL (ref 30.00–100.00)

## 2015-01-22 LAB — TSH: TSH: 0.83 u[IU]/mL (ref 0.35–4.50)

## 2015-01-28 ENCOUNTER — Ambulatory Visit (INDEPENDENT_AMBULATORY_CARE_PROVIDER_SITE_OTHER): Payer: BLUE CROSS/BLUE SHIELD | Admitting: Internal Medicine

## 2015-01-28 VITALS — BP 116/72 | Temp 98.1°F | Ht 67.0 in | Wt 202.3 lb

## 2015-01-28 DIAGNOSIS — E785 Hyperlipidemia, unspecified: Secondary | ICD-10-CM

## 2015-01-28 DIAGNOSIS — Z0000 Encounter for general adult medical examination without abnormal findings: Secondary | ICD-10-CM

## 2015-01-28 DIAGNOSIS — R7301 Impaired fasting glucose: Secondary | ICD-10-CM | POA: Diagnosis not present

## 2015-01-28 DIAGNOSIS — Z1501 Genetic susceptibility to malignant neoplasm of breast: Secondary | ICD-10-CM | POA: Diagnosis not present

## 2015-01-28 DIAGNOSIS — Z1502 Genetic susceptibility to malignant neoplasm of ovary: Secondary | ICD-10-CM

## 2015-01-28 DIAGNOSIS — Z1509 Genetic susceptibility to other malignant neoplasm: Secondary | ICD-10-CM

## 2015-01-28 MED ORDER — TRIAMCINOLONE ACETONIDE 0.1 % EX OINT: TOPICAL_OINTMENT | Freq: Two times a day (BID) | CUTANEOUS | Status: AC

## 2015-01-28 NOTE — Patient Instructions (Signed)
Continue lifestyle intervention healthy eating and exercise . Blood sugar is borderline  Advise at least yearly check on this  Can get fasting blood sugar and hg a1c in 6 months and  Yearly  .  Vit d level is ok . Cholesterol could be better  But   healthy eating and exercise .  Should help that.
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
## 2015-02-07 ENCOUNTER — Encounter: Payer: Self-pay | Admitting: Internal Medicine

## 2015-02-27 ENCOUNTER — Encounter: Payer: Self-pay | Admitting: *Deleted

## 2015-03-24 ENCOUNTER — Telehealth: Payer: Self-pay | Admitting: Internal Medicine

## 2015-03-24 NOTE — Telephone Encounter (Signed)
Needs an appt

## 2015-03-24 NOTE — Telephone Encounter (Signed)
Pt said she might have a bladder infection and is asking if she can come in and do a urine sample.Marland Kitchen

## 2015-03-25 NOTE — Telephone Encounter (Signed)
She said she will have the test done at work and if she feels she need to come see Dr Fabian Sharp she will call and make an appointment

## 2015-11-25 ENCOUNTER — Encounter: Payer: Self-pay | Admitting: Internal Medicine

## 2015-12-03 NOTE — Telephone Encounter (Signed)
Ok to send in transderm scopolamine patch  Disp  5   Apply  And change every 72 hours   alos ok to order  Thyroid ultrasound  Dx thyroid nodules  Last us 11 2015  At same venue where last one done

## 2015-12-04 ENCOUNTER — Other Ambulatory Visit: Payer: Self-pay | Admitting: Family Medicine

## 2015-12-04 DIAGNOSIS — E041 Nontoxic single thyroid nodule: Secondary | ICD-10-CM

## 2015-12-04 MED ORDER — SCOPOLAMINE 1 MG/3DAYS TD PT72: 1.0000 | MEDICATED_PATCH | TRANSDERMAL | Status: DC

## 2015-12-09 ENCOUNTER — Telehealth: Payer: Self-pay | Admitting: Internal Medicine

## 2015-12-09 NOTE — Telephone Encounter (Signed)
There is usually a prior certification permission from the insurance co   to get this done if not done in the ED.   would need to have OV here for us to be able  to order this .  Please  Contact patient    About this  Still need notes to review  If gets a lot worse  To ed .

## 2015-12-09 NOTE — Telephone Encounter (Signed)
[facility] and they think opt may have possible kidney stone. (blood in urine)  Pt was in lots of pain. Pt has asked them to send over office notes  They are advising Cat scan w/o contrast. But they have not done their job and pt is still waiting.  Pt is leaving the country on Friday for 2 weeks and was hoping to get this resolved. Pt wants to know if Dr Fabian SharpPanosh will do the referral after she sees the notes.

## 2015-12-10 ENCOUNTER — Ambulatory Visit
Admission: RE | Admit: 2015-12-10 | Discharge: 2015-12-10 | Disposition: A | Discharge status: Home / Self Care | Payer: BLUE CROSS/BLUE SHIELD | Source: Ambulatory Visit | Attending: Internal Medicine | Admitting: Internal Medicine

## 2015-12-10 DIAGNOSIS — E041 Nontoxic single thyroid nodule: Secondary | ICD-10-CM

## 2015-12-10 NOTE — Telephone Encounter (Signed)
Spoke to the pt.  Her pain is gone.  She if feeling better.  She is checking her urine at work.  No blood.  She will go on her trip on Friday.  Has an appointment to see Dr. Fabian SharpPanosh next month.  Will follow up then.  Will call back if needed.  At this time will not have imaging done.

## 2016-01-14 ENCOUNTER — Ambulatory Visit: Payer: BLUE CROSS/BLUE SHIELD | Admitting: Obstetrics and Gynecology
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
## 2016-02-12 ENCOUNTER — Ambulatory Visit (INDEPENDENT_AMBULATORY_CARE_PROVIDER_SITE_OTHER): Payer: BLUE CROSS/BLUE SHIELD | Admitting: Internal Medicine

## 2016-02-12 ENCOUNTER — Ambulatory Visit (INDEPENDENT_AMBULATORY_CARE_PROVIDER_SITE_OTHER)
Admission: RE | Admit: 2016-02-12 | Discharge: 2016-02-12 | Disposition: A | Discharge status: Home / Self Care | Payer: BLUE CROSS/BLUE SHIELD | Source: Ambulatory Visit | Attending: Internal Medicine | Admitting: Internal Medicine

## 2016-02-12 ENCOUNTER — Encounter: Payer: Self-pay | Admitting: Internal Medicine

## 2016-02-12 DIAGNOSIS — R1031 Right lower quadrant pain: Secondary | ICD-10-CM

## 2016-02-12 DIAGNOSIS — R8290 Unspecified abnormal findings in urine: Secondary | ICD-10-CM | POA: Diagnosis not present

## 2016-02-12 LAB — POC URINALSYSI DIPSTICK (AUTOMATED)
BILIRUBIN UA: NEGATIVE
Blood, UA: NEGATIVE
Glucose, UA: NEGATIVE
KETONES UA: NEGATIVE
LEUKOCYTES UA: NEGATIVE
Nitrite, UA: NEGATIVE
PH UA: 6
Protein, UA: NEGATIVE
Spec Grav, UA: 1.02
Urobilinogen, UA: 0.2

## 2016-02-12 NOTE — Patient Instructions (Addendum)
I agree concern about stone   Possible  Will arrange  abd ct renal stone protocol.. And then let you know results  Stay hydrated   Urine  Looks better today

## 2016-02-18 ENCOUNTER — Encounter: Payer: Self-pay | Admitting: Internal Medicine

## 2016-03-23 ENCOUNTER — Telehealth: Payer: Self-pay | Admitting: Internal Medicine

## 2016-03-23 NOTE — Telephone Encounter (Signed)
Message sent to the pharmacy

## 2016-03-23 NOTE — Telephone Encounter (Signed)
Pt would like to add vit d blood work [facility] tomorrow

## 2016-03-24 ENCOUNTER — Other Ambulatory Visit (INDEPENDENT_AMBULATORY_CARE_PROVIDER_SITE_OTHER): Payer: BLUE CROSS/BLUE SHIELD

## 2016-03-24 DIAGNOSIS — Z0000 Encounter for general adult medical examination without abnormal findings: Secondary | ICD-10-CM

## 2016-03-24 LAB — CBC WITH DIFFERENTIAL/PLATELET
BASOS ABS: 0 10*3/uL (ref 0.0–0.1)
BASOS PCT: 0.5 % (ref 0.0–3.0)
EOS ABS: 0.2 10*3/uL (ref 0.0–0.7)
Eosinophils Relative: 4.5 % (ref 0.0–5.0)
HEMATOCRIT: 36.4 % (ref 36.0–46.0)
HEMOGLOBIN: 12.3 g/dL (ref 12.0–15.0)
LYMPHS PCT: 33.6 % (ref 12.0–46.0)
Lymphs Abs: 1.6 10*3/uL (ref 0.7–4.0)
MCHC: 33.8 g/dL (ref 30.0–36.0)
MCV: 88.9 fl (ref 78.0–100.0)
MONO ABS: 0.5 10*3/uL (ref 0.1–1.0)
Monocytes Relative: 10.4 % (ref 3.0–12.0)
NEUTROS ABS: 2.5 10*3/uL (ref 1.4–7.7)
Neutrophils Relative %: 51 % (ref 43.0–77.0)
Platelets: 176 10*3/uL (ref 150.0–400.0)
RBC: 4.1 Mil/uL (ref 3.87–5.11)
RDW: 14.1 % (ref 11.5–15.5)
WBC: 4.8 10*3/uL (ref 4.0–10.5)

## 2016-03-24 LAB — BASIC METABOLIC PANEL
BUN: 20 mg/dL (ref 6–23)
CALCIUM: 9 mg/dL (ref 8.4–10.5)
CHLORIDE: 105 meq/L (ref 96–112)
CO2: 28 mEq/L (ref 19–32)
CREATININE: 0.67 mg/dL (ref 0.40–1.20)
GFR: 95.62 mL/min (ref >60.00)
Glucose, Bld: 104 mg/dL — ABNORMAL HIGH (ref 70–99)
Potassium: 4.3 mEq/L (ref 3.5–5.1)
Sodium: 138 mEq/L (ref 135–145)

## 2016-03-24 LAB — LIPID PANEL
CHOL/HDL RATIO: 4
Cholesterol: 223 mg/dL — ABNORMAL HIGH (ref 0–200)
HDL: 60.9 mg/dL (ref >39.00)
LDL CALC: 147 mg/dL — AB (ref 0–99)
NONHDL: 162.07
Triglycerides: 73 mg/dL (ref 0.0–149.0)
VLDL: 14.6 mg/dL (ref 0.0–40.0)

## 2016-03-24 LAB — VITAMIN D 25 HYDROXY (VIT D DEFICIENCY, FRACTURES): VITD: 30.33 ng/mL (ref 30.00–100.00)

## 2016-03-24 LAB — HEPATIC FUNCTION PANEL
ALK PHOS: 83 U/L (ref 39–117)
ALT: 16 U/L (ref 0–35)
AST: 15 U/L (ref 0–37)
Albumin: 3.6 g/dL (ref 3.5–5.2)
BILIRUBIN DIRECT: 0.1 mg/dL (ref 0.0–0.3)
BILIRUBIN TOTAL: 0.4 mg/dL (ref 0.2–1.2)
TOTAL PROTEIN: 6.1 g/dL (ref 6.0–8.3)

## 2016-03-24 LAB — TSH: TSH: 0.94 u[IU]/mL (ref 0.35–4.50)

## 2016-03-30 NOTE — Progress Notes (Signed)
Pre visit review using our clinic review tool, if applicable. No additional management support is needed unless otherwise documented below in the visit note.  Chief Complaint  Patient presents with  . Annual Exam    HPI: Patient  Maria Glass  60 y.o. comes in today for Preventive Health Care visit  orhto issues  No chagn ein health  Review ct abd   Health Maintenance  Topic Date Due  . INFLUENZA VACCINE  03/09/2016  . Hepatitis C Screening  03/31/2017 (Originally 1956/03/16)  . HIV Screening  03/31/2017 (Originally 10/30/1970)  . TETANUS/TDAP  05/09/2016  . PAP SMEAR  10/07/2017  . COLONOSCOPY  11/08/2023   Health Maintenance Review LIFESTYLE:  Exercise:  Limited some increasings Tobacco/ETS:n Alcohol: n Sugar beverages:n Sleep:6-7 Drug use: no Work 50- 50 hours  Neg fam hx  cv hh of 1     ROS:  GEN/ HEENT: No fever, significant weight changes sweats headaches vision problems hearing changes, CV/ PULM; No chest pain shortness of breath cough, syncope,edema  change in exercise tolerance. GI /GU: No adominal pain, vomiting, change in bowel habits. No blood in the stool. No significant GU symptoms. SKIN/HEME: ,no acute skin rashes suspicious lesions or bleeding. No lymphadenopathy, nodules, masses.  NEURO/ PSYCH:  No neurologic signs such as weakness numbness. No depression anxiety. IMM/ Allergy: No unusual infections.  Allergy .   REST of 12 system review negative except as per HPI   Past Medical History:  Diagnosis Date  . Atrial fibrillation ([place]) 04/28/2011   lone episode following shoulder surgery - normal echo  . BRCA gene positive    s/p bilateral mastectomy and hysterectomy.  . Hyperglycemia   . Hyperlipidemia   . Obesity   . Palpitations   . Thyroid disease 12/2012   9 mm left thyroid nodule w/microcalcifications--Dr. Regis Bill followinglas [place] was 10 15     Past Surgical History:  Procedure Laterality Date  . ABDOMINAL HYSTERECTOMY  fall 2009   R-TLH/BSO  . BREAST SURGERY  10/2009   bilateral prophylactic mastectomy  . CERVICAL POLYPECTOMY  03-25-08   -benign  . CESAREAN SECTION     x 3  . MASTECTOMY     bilateral  . ROTATOR CUFF REPAIR Left     Family History  Problem Relation Age of Onset  . Breast cancer Sister 20    dec age 70  . Breast cancer Mother 10    dec age 22  . Lung cancer Father   . Breast cancer Maternal Grandmother   . Cancer - Other Brother     merkel cell     Social History   Social History  . Marital status: Widowed    Spouse name: [name]  . Number of children: [name]  . Years of education: [name]   Occupational History  . works at [employer]    Social History Main Topics  . Smoking status: Never Smoker  . Smokeless tobacco: Never Used  . Alcohol use No  . Drug use: No  . Sexual activity: No     Comment: R-TLH/BSO   Other Topics Concern  . None   Social History Narrative   Widowed October 2009   Regular Exercise-yes   Plans on bm txp donor to her sis in the spring at [place]   Husband died from septicemia from bowel?   Works at [employer]   Has now sold her house and living with her son.  They buy  a condo is trying to eat healthier   Sleep 6-7 hours    Pet dog           Outpatient Medications Prior to Visit  Medication Sig Dispense Refill  . Ascorbic Acid (VITAMIN C PO) Take by mouth.    Marland Kitchen aspirin 81 MG tablet Take 81 mg by mouth daily.    . Calcium Carbonate-Vitamin D (CALTRATE 600+D) 600-400 MG-UNIT per tablet Take 1 tablet by mouth daily.    . Cholecalciferol (VITAMIN D3) 2000 UNITS TABS Take by mouth. Taking 1 in the morning and 1 at night    . etodolac (LODINE) 400 MG tablet Take 400 mg by mouth 2 (two) times daily.   2  . FLUTICASONE PROPIONATE, NASAL, NA Place into the nose. 1 spray in each nostril daily    . GLUCOSAMINE-CHONDROITIN PO Take by mouth.    . Multiple Vitamins-Minerals (MULTIVITAMIN PO) Take by mouth.    . Probiotic Product (ALIGN) 4 MG  CAPS Take 1 tablet by mouth daily.    Marland Kitchen scopolamine (TRANSDERM-SCOP, 1.5 MG,) 1 MG/3DAYS Place 1 patch (1.5 mg total) onto the skin every 3 (three) days. 5 patch 0  . fish oil-omega-3 fatty acids 1000 MG capsule Taking 1-2 daily     No facility-administered medications prior to visit.      EXAM:  BP (!) 146/78 (BP Location: Right Arm, Patient Position: Sitting, Cuff Size: Large)   Temp 98 F (36.7 C) (Oral)   Ht 5' 7"  (1.702 m)   Wt 216 lb 9.6 oz (98.2 kg)   LMP 05/07/2008   BMI 33.92 kg/m   Body mass index is 33.92 kg/m.  Physical Exam: Vital signs reviewed GHW:EXHB is a well-developed well-nourished alert cooperative    who appearsr stated age in no acute distress.  HEENT: normocephalic atraumatic , Eyes: PERRL EOM's full, conjunctiva clear, Nares: paten,t no deformity discharge or tenderness., Ears: no deformity EAC's clear TMs with normal landmarks. Mouth: clear OP, no lesions, edema.  Moist mucous membranes. Dentition in adequate repair. NECK: supple without masses, thyromegaly or bruits. CHEST/PULM:  Clear to auscultation and percussion breath sounds equal no wheeze , rales or rhonchi. No chest wall deformities or tenderness. Breast absent  Axilla clear  CV: PMI is nondisplaced, S1 S2 no gallops, murmurs, rubs. Peripheral pulses are full without delay.No JVD .  Some vv  ABDOMEN: Bowel sounds normal nontender  No guard or rebound, no hepato splenomegal no CVA tenderness.  No hernia. Extremtities:  No clubbing cyanosis or edema, no acute joint swelling or redness no focal atrophy NEURO:  Oriented x3, cranial nerves 3-12 appear to be intact, no obvious focal weakness,gait within normal limits no abnormal reflexes or asymmetrical SKIN: No acute rashes normal turgor, color, no bruising or petechiae. Fading abrasion left leg   PSYCH: Oriented, good eye contact, no obvious depression anxiety, cognition and judgment appear normal. LN: no cervical axillary inguinal adenopathy  Lab  Results  Component Value Date   WBC 4.8 03/24/2016   HGB 12.3 03/24/2016   HCT 36.4 03/24/2016   PLT 176.0 03/24/2016   GLUCOSE 104 (H) 03/24/2016   CHOL 223 (H) 03/24/2016   TRIG 73.0 03/24/2016   HDL 60.90 03/24/2016   LDLDIRECT 151.3 04/18/2013   LDLCALC 147 (H) 03/24/2016   ALT 16 03/24/2016   AST 15 03/24/2016   NA 138 03/24/2016   K 4.3 03/24/2016   CL 105 03/24/2016   CREATININE 0.67 03/24/2016   BUN 20 03/24/2016  CO2 28 03/24/2016   TSH 0.94 03/24/2016   HGBA1C 5.7 04/18/2013    ASSESSMENT AND PLAN:  Discussed the following assessment and plan:  Visit for preventive health examination  BRCA gene positive  Hyperglycemia  HLD (hyperlipidemia) - Plan: CT CARDIAC SCORING  Vascular calcification - incidental scattered  AI area on ct scan - Plan: CT CARDIAC SCORING Discussed risk-benefit prevention screening consideration of treatment for her hyperlipidemia which is very mild but there is some early scattered vascular calcification on her CT scan of incidental finding. Will order coronary artery calcium scan consideration of statin medication discussion risk-benefit and follow-up. She had surgical menopause but was at a menopausal age at the time. No other risk factors. She will intensify lifestyle intervention. Reviewed diet and strategies. Generally healthy eating.  bp usually very good  Patient Care Team: Burnis Medin, MD as PCP - General Netta Cedars, MD (Orthopedic Surgery) Juanita Craver, MD as Attending Physician (Gastroenterology) Nunzio Cobbs, MD as Consulting Physician (Obstetrics and Gynecology) Patient Instructions   Intensify lifestyle interventions. As we discussed .  Will order coronary artery calcium scan to assess risk  And consideration of adding statin medication.  Such as lipitor crestor  Other .    Yearly check up  Otherwise .  Make sure your blood pressure is below 140/90 and if not then we may need to add medication.   Wt  Readings from Last 3 Encounters:  03/31/16 216 lb 9.6 oz (98.2 kg)  02/12/16 217 lb 3.2 oz (98.5 kg)  01/28/15 202 lb 4.8 oz (91.8 kg)      Vilas Edgerly K. Mardene Lessig M.D. Scattered aortoiliac arterial calcifications without aneurysm. No adenopathy. Bilateral pelvic phleboliths.

## 2016-03-31 ENCOUNTER — Encounter: Payer: Self-pay | Admitting: Internal Medicine

## 2016-03-31 ENCOUNTER — Ambulatory Visit (INDEPENDENT_AMBULATORY_CARE_PROVIDER_SITE_OTHER): Payer: BLUE CROSS/BLUE SHIELD | Admitting: Internal Medicine

## 2016-03-31 VITALS — BP 146/78 | Temp 98.0°F | Ht 67.0 in | Wt 216.6 lb

## 2016-03-31 DIAGNOSIS — Z0000 Encounter for general adult medical examination without abnormal findings: Secondary | ICD-10-CM | POA: Diagnosis not present

## 2016-03-31 DIAGNOSIS — Z1501 Genetic susceptibility to malignant neoplasm of breast: Secondary | ICD-10-CM | POA: Diagnosis not present

## 2016-03-31 DIAGNOSIS — R739 Hyperglycemia, unspecified: Secondary | ICD-10-CM | POA: Diagnosis not present

## 2016-03-31 DIAGNOSIS — I998 Other disorder of circulatory system: Secondary | ICD-10-CM

## 2016-03-31 DIAGNOSIS — E785 Hyperlipidemia, unspecified: Secondary | ICD-10-CM | POA: Diagnosis not present

## 2016-03-31 DIAGNOSIS — Z1509 Genetic susceptibility to other malignant neoplasm: Secondary | ICD-10-CM

## 2016-03-31 DIAGNOSIS — Z1502 Genetic susceptibility to malignant neoplasm of ovary: Secondary | ICD-10-CM

## 2016-03-31 NOTE — Patient Instructions (Addendum)
Intensify lifestyle interventions. As we discussed .  Will order coronary artery calcium scan to assess risk  And consideration of adding statin medication.  Such as lipitor crestor  Other .    Yearly check up  Otherwise .  Make sure your blood pressure is below 140/90 and if not then we may need to add medication.   Wt Readings from Last 3 Encounters:  03/31/16 216 lb 9.6 oz (98.2 kg)  02/12/16 217 lb 3.2 oz (98.5 kg)  01/28/15 202 lb 4.8 oz (91.8 kg)

## 2016-05-04 ENCOUNTER — Ambulatory Visit (INDEPENDENT_AMBULATORY_CARE_PROVIDER_SITE_OTHER)
Admission: RE | Admit: 2016-05-04 | Discharge: 2016-05-04 | Disposition: A | Discharge status: Home / Self Care | Payer: Self-pay | Source: Ambulatory Visit | Attending: Internal Medicine | Admitting: Internal Medicine

## 2016-05-04 DIAGNOSIS — E785 Hyperlipidemia, unspecified: Secondary | ICD-10-CM

## 2016-05-04 DIAGNOSIS — I998 Other disorder of circulatory system: Secondary | ICD-10-CM

## 2016-05-05 ENCOUNTER — Encounter: Payer: Self-pay | Admitting: *Deleted

## 2016-05-18 ENCOUNTER — Encounter: Payer: Self-pay | Admitting: Internal Medicine

## 2016-05-18 ENCOUNTER — Ambulatory Visit (INDEPENDENT_AMBULATORY_CARE_PROVIDER_SITE_OTHER): Payer: BLUE CROSS/BLUE SHIELD | Admitting: Internal Medicine

## 2016-05-18 VITALS — BP 124/74 | HR 67 | Temp 98.1°F | Wt 217.3 lb

## 2016-05-18 DIAGNOSIS — J060 Acute laryngopharyngitis: Secondary | ICD-10-CM | POA: Diagnosis not present

## 2016-05-18 MED ORDER — HYDROCODONE-HOMATROPINE 5-1.5 MG/5ML PO SYRP: ORAL_SOLUTION | ORAL | 0 refills | Status: DC

## 2016-05-18 NOTE — Progress Notes (Signed)
Chief Complaint  Patient presents with  . Cough  . Headache  . Hoarse    HPI: Maria Glass 60 y.o. comes in today with acute problem. 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 Over the last 3 days she's developed a serious cough that keeps her up at night at times using over-the-counter cough drops in his neck. No fever chest pain hemoptysis or shaking chills. No face pain or extreme nasal drainage. No hx wheezing.  No sob   Cough worse at night mostly dry  Got flu vaccine last week  ROS: See pertinent positives and negatives per HPI.  Past Medical History:  Diagnosis Date  . Atrial fibrillation ([place]) 04/28/2011   lone episode following shoulder surgery - normal echo  . BRCA gene positive    s/p bilateral mastectomy and hysterectomy.  . Hyperglycemia   . Hyperlipidemia   . Obesity   . Palpitations   . Thyroid disease 12/2012   9 mm left thyroid nodule w/microcalcifications--Dr. Regis Bill followinglas [place] was 10 15     Family History  Problem Relation Age of Onset  . Breast cancer Sister 34    dec age 97  . Breast cancer Mother 3    dec age 10  . Lung cancer Father   . Breast cancer Maternal Grandmother   . Cancer - Other Brother     merkel cell     Social History   Social History  . Marital status: Widowed    Spouse name: [name]  . Number of children: [name]  . Years of education: [name]   Occupational History  . works at [employer]    Social History Main Topics  . Smoking status: Never Smoker  . Smokeless tobacco: Never Used  . Alcohol use No  . Drug use: No  . Sexual activity: No     Comment: R-TLH/BSO   Other Topics Concern  . None   Social History Narrative   Widowed October 2009   Regular Exercise-yes   Plans on bm txp donor to her sis in the spring at [place]   Husband died from septicemia from bowel?   Works at [place]   Has now sold her house and living with her son.  They buy a condo is trying to eat healthier   Sleep 6-7 hours    Pet dog           Outpatient Medications Prior to Visit  Medication Sig Dispense Refill  . Ascorbic Acid (VITAMIN C PO) Take by mouth.    Marland Kitchen aspirin 81 MG tablet Take 81 mg by mouth daily.    . Calcium Carbonate-Vitamin D (CALTRATE 600+D) 600-400 MG-UNIT per tablet Take 1 tablet by mouth daily.    . Cholecalciferol (VITAMIN D3) 2000 UNITS TABS Take by mouth. Taking 1 in the morning and 1 at night    . etodolac (LODINE) 400 MG tablet Take 400 mg by mouth 2 (two) times daily.   2  . FLUTICASONE PROPIONATE, NASAL, NA Place into the nose. 1 spray in each nostril daily    . GLUCOSAMINE-CHONDROITIN PO Take by mouth.    . Multiple Vitamins-Minerals (MULTIVITAMIN PO) Take by mouth.    . Probiotic Product (ALIGN) 4 MG CAPS Take 1 tablet by mouth daily.    Marland Kitchen scopolamine (TRANSDERM-SCOP, 1.5 MG,) 1 MG/3DAYS Place 1 patch (  1.5 mg total) onto the skin every 3 (three) days. 5 patch 0   No facility-administered medications prior to visit.      EXAM:  BP 124/74 (BP Location: Right Arm, Patient Position: Sitting, Cuff Size: Large)   Pulse 67   Temp 98.1 F (36.7 C) (Oral)   Wt 217 lb 4.8 oz (98.6 kg)   LMP 05/07/2008   SpO2 98%   BMI 34.03 kg/m   Body mass index is 34.03 kg/m. WDWN in NAD  quiet respirations;  somewhat hoarse. Non toxic . HEENT: Normocephalic ;atraumatic , Eyes;  PERRL, EOMs  Full, lids and conjunctiva clear,,Ears: no deformities, canals nl, TM landmarks normal, Nose: no deformity or discharge but congested;face non  tender Mouth : OP clear without lesion or edema .tonsil 1+   Neck: Supple without adenopathy  or bruits Chest:  Clear to A&P without wheezes rales or rhonchi CV:  S1-S2 no gallops or murmurs peripheral perfusion is normal Skin :nl perfusion and no acute rashes   ASSESSMENT AND PLAN:  Discussed the following assessment and plan:  Acute  laryngopharyngitis - eveolving mostly viral  presentation   [employer] for comfort and expectant management.   -Patient advised to return or notify health care team  if symptoms worsen ,persist or new concerns arise.  Patient Instructions  This acts like a viral respiratory infection .    That should run its course.    realtive voice rest  Fluids to stay hydrated  Cough med for comfort .   Fu if fever chills  Short of breath   Coughing up blood  Or  Not improved in another week   Or severe pain  .    Laryngitis Laryngitis is inflammation of your vocal cords. This causes hoarseness, coughing, loss of voice, sore throat, or a dry throat. Your vocal cords are two bands of muscles that are found in your throat. When you speak, these cords come together and vibrate. These vibrations come out through your mouth as sound. When your vocal cords are inflamed, your voice sounds different. Laryngitis can be temporary (acute) or long-term (chronic). Most cases of acute laryngitis improve with time. Chronic laryngitis is laryngitis that lasts for more than three weeks. CAUSES Acute laryngitis may be caused by:  A viral infection.  Lots of talking, yelling, or singing. This is also called vocal strain.  Bacterial infections. Chronic laryngitis may be caused by:  Vocal strain.  Injury to your vocal cords.  Acid reflux (gastroesophageal reflux disease or GERD).  Allergies.  Sinus infection.  Smoking.  Alcohol abuse.  Breathing in chemicals or dust.  Growths on the vocal cords. RISK FACTORS Risk factors for laryngitis include:  Smoking.  Alcohol abuse.  Having allergies. SIGNS AND SYMPTOMS Symptoms of laryngitis may include:  Low, hoarse voice.  Loss of voice.  Dry cough.  Sore throat.  Stuffy nose. DIAGNOSIS Laryngitis may be diagnosed by:  Physical exam.  Throat culture.  Blood test.  Laryngoscopy. This procedure allows your health care provider to look at your vocal  cords with a mirror or viewing tube. TREATMENT Treatment for laryngitis depends on what is causing it. Usually, treatment involves resting your voice and using medicines to soothe your throat. However, if your laryngitis is caused by a bacterial infection, you may need to take antibiotic medicine. If your laryngitis is caused by a growth, you may need to have a procedure to remove it. HOME CARE INSTRUCTIONS  Drink enough fluid to keep your urine  clear or pale yellow.  Breathe in moist air. Use a humidifier if you live in a dry climate.  Take medicines only as directed by your health care provider.  If you were prescribed an antibiotic medicine, finish it all even if you start to feel better.  Do not smoke cigarettes or electronic cigarettes. If you need help quitting, ask your health care provider.  Talk as little as possible. Also avoid whispering, which can cause vocal strain.  Write instead of talking. Do this until your voice is back to normal. SEEK MEDICAL CARE IF:  You have a fever.  You have increasing pain.  You have difficulty swallowing. SEEK IMMEDIATE MEDICAL CARE IF:  You cough up blood.  You have trouble breathing.   This information is not intended to replace advice given to you by your health care provider. Make sure you discuss any questions you have with your health care provider.   Document Released: 07/26/2005 Document Revised: 08/16/2014 Document Reviewed: 01/08/2014 Elsevier Interactive Patient Education 2016 [place] K. Curlie Sittner M.D.

## 2016-05-18 NOTE — Progress Notes (Signed)
Pre visit review using our clinic review tool, if applicable. No additional management support is needed unless otherwise documented below in the visit note. 

## 2016-05-18 NOTE — Patient Instructions (Addendum)
This acts like a viral respiratory infection .    That should run its course.    realtive voice rest  Fluids to stay hydrated  Cough med for comfort .   Fu if fever chills  Short of breath   Coughing up blood  Or  Not improved in another week   Or severe pain  .    Laryngitis Laryngitis is inflammation of your vocal cords. This causes hoarseness, coughing, loss of voice, sore throat, or a dry throat. Your vocal cords are two bands of muscles that are found in your throat. When you speak, these cords come together and vibrate. These vibrations come out through your mouth as sound. When your vocal cords are inflamed, your voice sounds different. Laryngitis can be temporary (acute) or long-term (chronic). Most cases of acute laryngitis improve with time. Chronic laryngitis is laryngitis that lasts for more than three weeks. CAUSES Acute laryngitis may be caused by:  A viral infection.  Lots of talking, yelling, or singing. This is also called vocal strain.  Bacterial infections. Chronic laryngitis may be caused by:  Vocal strain.  Injury to your vocal cords.  Acid reflux (gastroesophageal reflux disease or GERD).  Allergies.  Sinus infection.  Smoking.  Alcohol abuse.  Breathing in chemicals or dust.  Growths on the vocal cords. RISK FACTORS Risk factors for laryngitis include:  Smoking.  Alcohol abuse.  Having allergies. SIGNS AND SYMPTOMS Symptoms of laryngitis may include:  Low, hoarse voice.  Loss of voice.  Dry cough.  Sore throat.  Stuffy nose. DIAGNOSIS Laryngitis may be diagnosed by:  Physical exam.  Throat culture.  Blood test.  Laryngoscopy. This procedure allows your health care provider to look at your vocal cords with a mirror or viewing tube. TREATMENT Treatment for laryngitis depends on what is causing it. Usually, treatment involves resting your voice and using medicines to soothe your throat. However, if your laryngitis is caused by a  bacterial infection, you may need to take antibiotic medicine. If your laryngitis is caused by a growth, you may need to have a procedure to remove it. HOME CARE INSTRUCTIONS  Drink enough fluid to keep your urine clear or pale yellow.  Breathe in moist air. Use a humidifier if you live in a dry climate.  Take medicines only as directed by your health care provider.  If you were prescribed an antibiotic medicine, finish it all even if you start to feel better.  Do not smoke cigarettes or electronic cigarettes. If you need help quitting, ask your health care provider.  Talk as little as possible. Also avoid whispering, which can cause vocal strain.  Write instead of talking. Do this until your voice is back to normal. SEEK MEDICAL CARE IF:  You have a fever.  You have increasing pain.  You have difficulty swallowing. SEEK IMMEDIATE MEDICAL CARE IF:  You cough up blood.  You have trouble breathing.   This information is not intended to replace advice given to you by your health care provider. Make sure you discuss any questions you have with your health care provider.   Document Released: 07/26/2005 Document Revised: 08/16/2014 Document Reviewed: 01/08/2014 Elsevier Interactive Patient Education [employer]2016 Elsevier Inc[employer]

## 2016-08-27 ENCOUNTER — Ambulatory Visit (INDEPENDENT_AMBULATORY_CARE_PROVIDER_SITE_OTHER): Payer: BLUE CROSS/BLUE SHIELD | Admitting: Obstetrics and Gynecology

## 2016-08-27 ENCOUNTER — Encounter: Payer: Self-pay | Admitting: Obstetrics and Gynecology

## 2016-08-27 VITALS — BP 134/78 | HR 64 | Resp 16 | Ht 66.5 in | Wt 216.2 lb

## 2016-08-27 DIAGNOSIS — Z01419 Encounter for gynecological examination (general) (routine) without abnormal findings: Secondary | ICD-10-CM

## 2016-08-27 DIAGNOSIS — R1900 Intra-abdominal and pelvic swelling, mass and lump, unspecified site: Secondary | ICD-10-CM

## 2016-08-27 NOTE — Patient Instructions (Signed)

## 2016-08-27 NOTE — Progress Notes (Signed)
61 y.o. [id number] Widowed Caucasian female here for annual exam.    Had a CT scan of abdomen and pelvis in July 2017 and had negative study. No stones.   Hx BRCA positive status.   Hx bruising.  Takes ASA a couple of times a week.  PCP:   Shanon Ace, MD  Patient's last menstrual period was 05/07/2008.           Sexually active: No.  The current method of family planning is status post hysterectomy.    Exercising: Yes.    Gym 2-3x/week Smoker:  no  Health Maintenance: Pap:  2009 normal History of abnormal Pap:  no MMG:  12/2008 normal; prophylactic bilateral mastectomy 2011. Colonoscopy:  04/2013 polyps with Dr. Lenise Herald due 04/2018 BMD: 08-29-13 Result:Normal:[facility] TDaP: 05/2006.  Will do with PCP. Gardasil:   [name] HIV:  Donates blood regularly. Hep C:  Donates blood regularly. Screening Labs:  Hb today: PCP, Urine today: PCP   reports that she has never smoked. She has never used smokeless tobacco. She reports that she does not drink alcohol or use drugs.  Past Medical History:  Diagnosis Date  . Atrial fibrillation ([place]) 04/28/2011   lone episode following shoulder surgery - normal echo  . BRCA gene positive    s/p bilateral mastectomy and hysterectomy.  . Hyperglycemia   . Hyperlipidemia   . Obesity   . Palpitations   . Thyroid disease 12/2012   9 mm left thyroid nodule w/microcalcifications--Dr. Regis Bill followinglas [place] was 10 15     Past Surgical History:  Procedure Laterality Date  . ABDOMINAL HYSTERECTOMY  fall 2009   R-TLH/BSO  . BREAST SURGERY  10/2009   bilateral prophylactic mastectomy  . CERVICAL POLYPECTOMY  03-25-08   -benign  . CESAREAN SECTION     x 3  . MASTECTOMY     bilateral  . ROTATOR CUFF REPAIR Left     Current Outpatient Prescriptions  Medication Sig Dispense Refill  . Ascorbic Acid (VITAMIN C PO) Take by mouth.    Marland Kitchen aspirin 81 MG tablet Take 81 mg by mouth daily.    . Calcium Carbonate-Vitamin D (CALTRATE 600+D) 600-400 MG-UNIT per  tablet Take 1 tablet by mouth daily.    . Cholecalciferol (VITAMIN D3) 2000 UNITS TABS Take by mouth. Taking 1 in the morning and 1 at night    . etodolac (LODINE) 400 MG tablet Take 400 mg by mouth 2 (two) times daily.   2  . Multiple Vitamins-Minerals (MULTIVITAMIN PO) Take by mouth.    . Probiotic Product (ALIGN) 4 MG CAPS Take 1 tablet by mouth daily.     No current facility-administered medications for this visit.     Family History  Problem Relation Age of Onset  . Breast cancer Sister 27    dec age 60  . Breast cancer Mother 9    dec age 72  . Lung cancer Father   . Breast cancer Maternal Grandmother   . Cancer - Other Brother     merkel cell   . Cancer Brother 20    Lung CA--Mesothelioma    ROS:  Pertinent items are noted in HPI.  Otherwise, a comprehensive ROS was negative.  Exam:   BP 134/78 (BP Location: Left Arm, Patient Position: Sitting, Cuff Size: Large)   Pulse 64   Resp 16   Ht 5' 6.5" (1.689 m)   Wt 216 lb 3.2 oz (98.1 kg)   LMP 05/07/2008   BMI 34.37 kg/m  General appearance: alert, cooperative and appears stated age Head: Normocephalic, without obvious abnormality, atraumatic Neck: no adenopathy, supple, symmetrical, trachea midline and thyroid normal to inspection and palpation Lungs: clear to auscultation bilaterally Breasts:  Absent bilaterally.  Heart: regular rate and rhythm Abdomen: soft, non-tender; no masses, no organomegaly Extremities: extremities normal, atraumatic, no cyanosis or edema Skin: Skin color, texture, turgor normal. No rashes or lesions Lymph nodes: Cervical, supraclavicular, and axillary nodes normal. No abnormal inguinal nodes palpated Neurologic: Grossly normal  Pelvic: External genitalia:  no lesions              Urethra:  normal appearing urethra with no masses, tenderness or lesions              Bartholins and Skenes: normal                 Vagina: normal appearing vagina with normal color and discharge, no  lesions              Cervix:  Absent.               Pap taken: No. Bimanual Exam:  Uterus:  Absent.               Adnexa:  Mass palpable at the top of vagina, 3 - 4 cm.  Bowel loop?              Rectal exam: Yes.  .  Confirms bimanual exam.               Anus:  normal sphincter tone, no lesions  Chaperone was present for exam.  Assessment:   Well woman visit with normal exam. Status post robotic TLH/BSO. Status post prophylactic bilateral mastectomy.   BRCA positive. Pelvic mass.   Plan:    Guidelines for Calcium, Vitamin D, regular exercise program including cardiovascular and weight bearing exercise. TDap through PCP.  Routine labs through PCP.  Return for pelvic ultrasound.  Patient given the alternative to have a repeat pelvic exam on another day and do ultrasound only if the mass is persistent, but we decided together to just have her return for a pelvic ultrasound due to her BRCA positive status.  Follow up annually and prn.       After visit summary provided.

## 2016-08-30 ENCOUNTER — Telehealth: Payer: Self-pay | Admitting: Obstetrics and Gynecology

## 2016-08-30 NOTE — Telephone Encounter (Signed)
Call patient to review benefits for a recommended ultrasound. Left Voicemail requesting a call.

## 2016-08-30 NOTE — Telephone Encounter (Signed)
Spoke with patient regarding benefit for ultrasound. Patient understood and agreeable. Patient ready to schedule. Patient scheduled 09/02/16 with Dr Edward JollySilva.  Patient aware of date, arrival time can cancellation policy.  No further questions. Ok to close

## 2016-09-02 ENCOUNTER — Ambulatory Visit (INDEPENDENT_AMBULATORY_CARE_PROVIDER_SITE_OTHER): Payer: BLUE CROSS/BLUE SHIELD | Admitting: Obstetrics and Gynecology

## 2016-09-02 ENCOUNTER — Ambulatory Visit (INDEPENDENT_AMBULATORY_CARE_PROVIDER_SITE_OTHER): Payer: BLUE CROSS/BLUE SHIELD

## 2016-09-02 ENCOUNTER — Encounter: Payer: Self-pay | Admitting: Obstetrics and Gynecology

## 2016-09-02 VITALS — BP 128/76 | HR 68 | Resp 16 | Wt 216.0 lb

## 2016-09-02 DIAGNOSIS — Z01411 Encounter for gynecological examination (general) (routine) with abnormal findings: Secondary | ICD-10-CM

## 2016-09-02 DIAGNOSIS — Z1501 Genetic susceptibility to malignant neoplasm of breast: Secondary | ICD-10-CM

## 2016-09-02 DIAGNOSIS — R1900 Intra-abdominal and pelvic swelling, mass and lump, unspecified site: Secondary | ICD-10-CM | POA: Diagnosis not present

## 2016-09-02 DIAGNOSIS — Z1509 Genetic susceptibility to other malignant neoplasm: Secondary | ICD-10-CM

## 2016-09-02 DIAGNOSIS — Z1502 Genetic susceptibility to malignant neoplasm of ovary: Secondary | ICD-10-CM
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
## 2017-02-23 IMAGING — CT CT HEART SCORING
2 series · 16 of 20 positions shown, 18 images · non-contrast
Comparison: CT the abdomen and pelvis 02/12/2016.

CLINICAL DATA: Risk stratification

EXAM:
Coronary Calcium Score
TECHNIQUE: The patient was scanned on a Siemens Somatom 64 slice scanner. Axial
non-contrast 3 mm slices were carried out through the heart. The
data set was analyzed on a dedicated work station and scored using
the Agatson method.

[Series 2: casc 3.0 i36f 2 bestdiast 68 % · axial · 0.35mm/px · z∈[-226,-133]mm · 8 of 41 slices shown, 10 images]
[im 5/41  vessel]
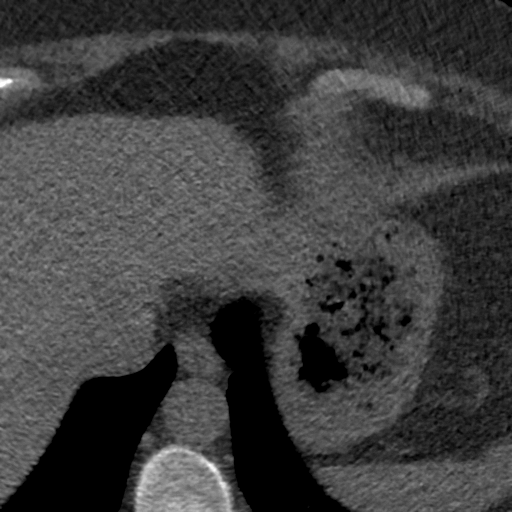
[im 5/41  lung]
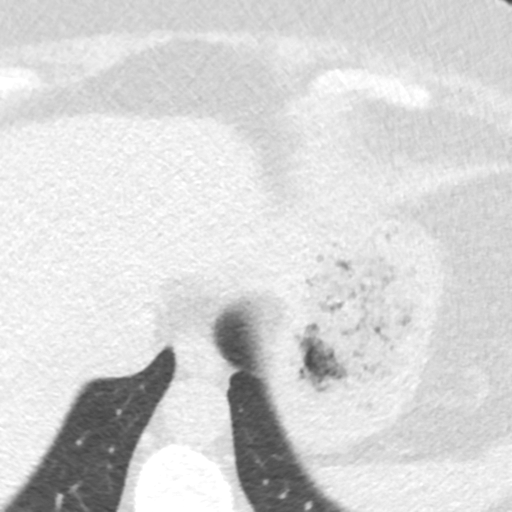
[im 9/41  vessel]
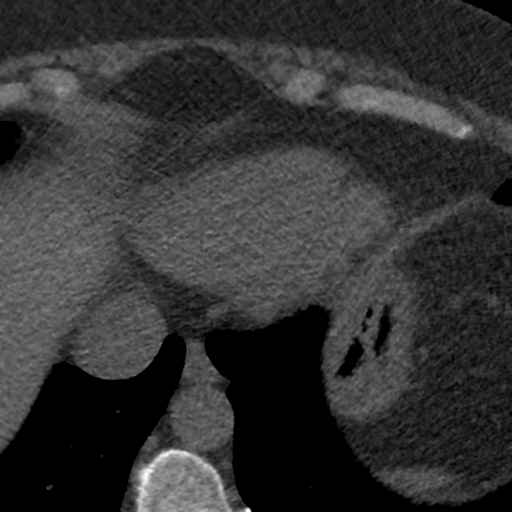
[im 14/41  vessel]
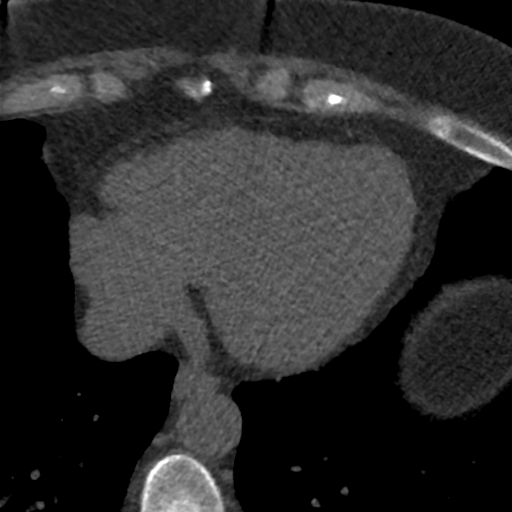
[im 18/41  vessel]
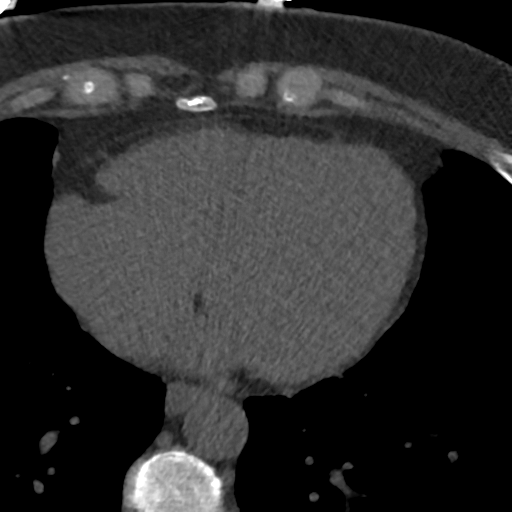
[im 23/41  vessel]
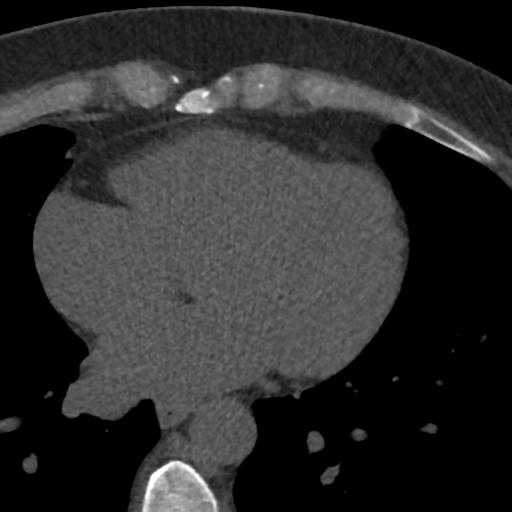
[im 23/41  lung]
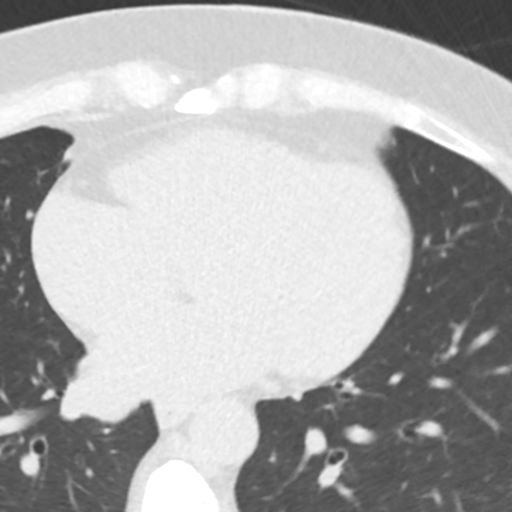
[im 27/41  vessel]
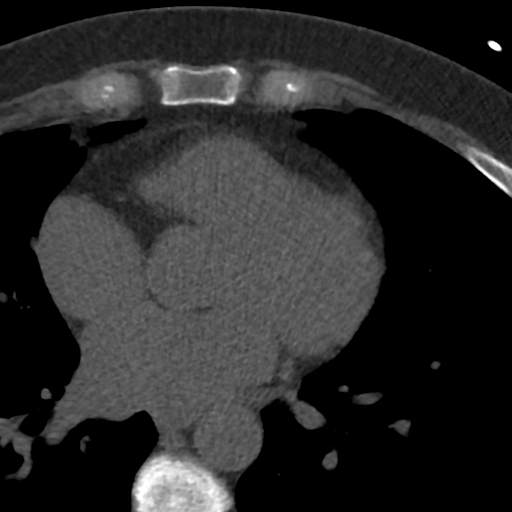
[im 32/41  vessel]
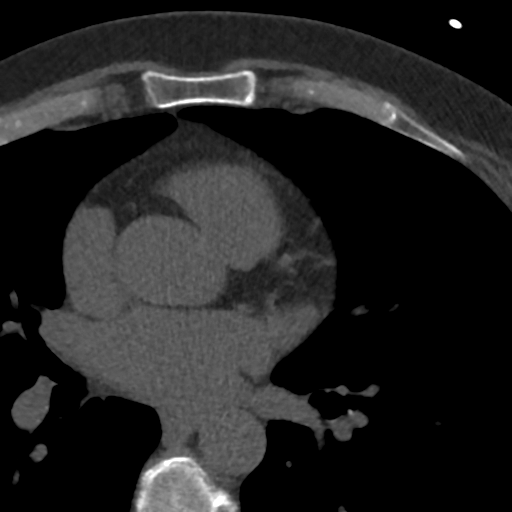
[im 36/41  vessel]
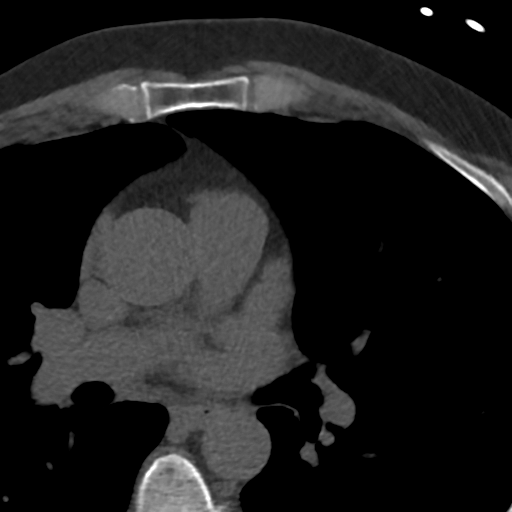

[Series 4: lung st 67 % · axial · 0.69mm/px · z∈[-226,-133]mm · 8 of 41 slices shown]
[im 5/41  lung]
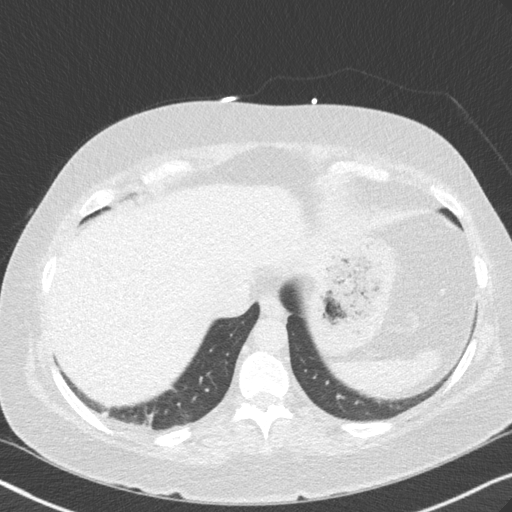
[im 9/41  lung]
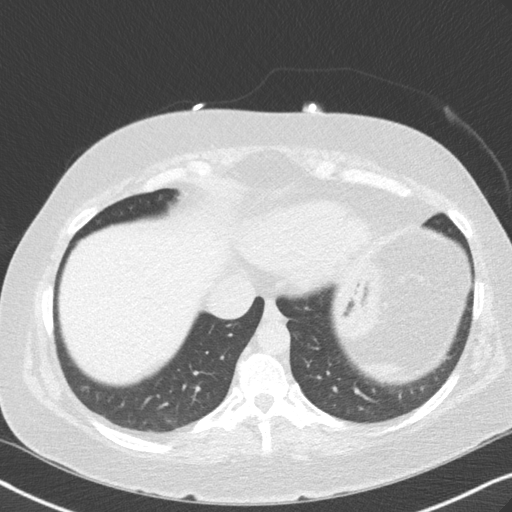
[im 14/41  lung]
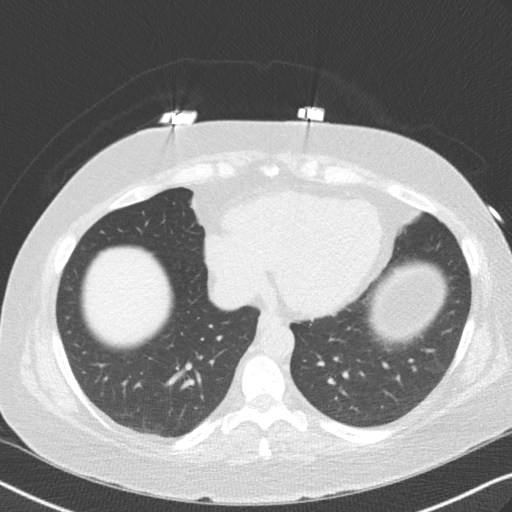
[im 18/41  lung]
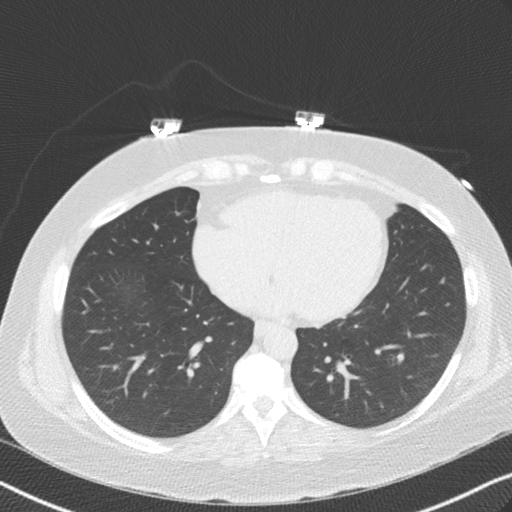
[im 23/41  lung]
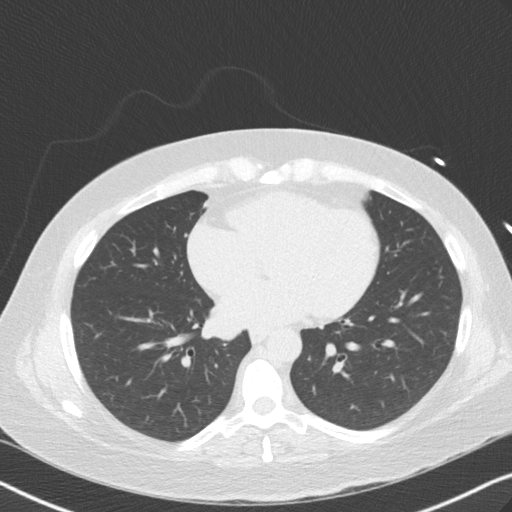
[im 27/41  lung]
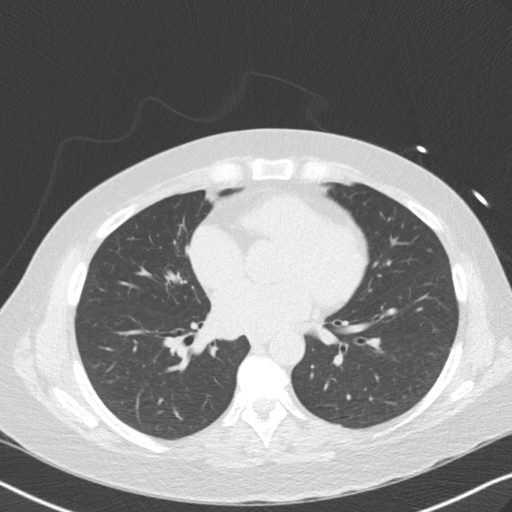
[im 32/41  lung]
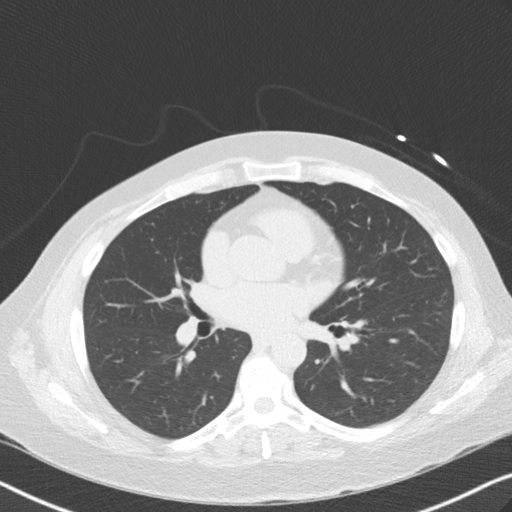
[im 36/41  lung]
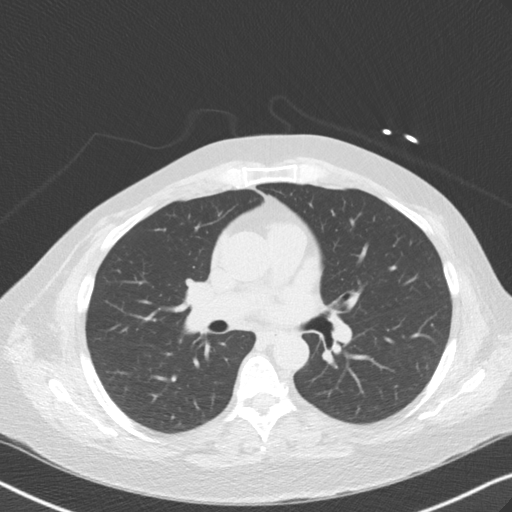

[16 of 20 positions shown; findings below may reference images not displayed]

FINDINGS: Non-cardiac: See separate report from [REDACTED].

Ascending Aorta:  Normal size.  No calcifications.

Pericardium: Normal.

Coronary arteries:  Normal origin.
IMPRESSION: Coronary calcium score of 0. This was 0 percentile for age and sex
matched control.

Saidially Perectionist

EXAM:
OVER-READ INTERPRETATION  CT CHEST

The following report is an over-read performed by radiologist Dr.
over-read does not include interpretation of cardiac or coronary
anatomy or pathology. The coronary calcium score interpretation by
the cardiologist is attached.
FINDINGS: Mild scarring in the inferior aspect of the right lower lobe. Within
the visualized portions of the thorax there are no suspicious
appearing pulmonary nodules or masses, there is no acute
consolidative airspace disease, no pleural effusions, no
pneumothorax and no lymphadenopathy. Visualized portions of the
upper abdomen are unremarkable. There are no aggressive appearing
lytic or blastic lesions noted in the visualized portions of the
skeleton. Although incompletely visualized, there appear to be
postoperative changes of bilateral modified radical mastectomies.
IMPRESSION: No significant incidental noncardiac findings are noted.

## 2017-03-28 ENCOUNTER — Other Ambulatory Visit: Payer: BLUE CROSS/BLUE SHIELD

## 2017-04-03 NOTE — Progress Notes (Signed)
Chief Complaint  Patient presents with  . Annual Exam    Pt states she has no complaints as of today    HPI: Patient  Maria Glass  61 y.o. comes in today for Preventive Health Care visit  Has had shingrix .  tdap and shingrix .   Getting a grand child.  twins in October  Asks a bout mmr booster   ? No native disease.  To get lfu vaccine a t work  Ask about vit d  On 2000 iu  Gets sore upper back after work out not  Joints  Certain activity  ses  Gyne  monitoring  Health Maintenance  Topic Date Due  . Hepatitis C Screening  Nov 24, 1955  . HIV Screening  10/30/1971  . TETANUS/TDAP  05/09/2016  . INFLUENZA VACCINE  03/09/2017  . PAP SMEAR  10/07/2017  . COLONOSCOPY  11/08/2023   Health Maintenance Review LIFESTYLE:  Exercise:  gyme  3 x per week  Tobacco/ETS:   no Alcohol:  no Sugar beverages: no Sleep: not enough about 6  Drug use: no HH of  1 no pets  Work: 40+     ROS:  GEN/ HEENT: No fever, significant weight changes sweats headaches vision problems hearing changes, CV/ PULM; No chest pain shortness of breath cough, syncope,edema  change in exercise tolerance. GI /GU: No adominal pain, vomiting, change in bowel habits. No blood in the stool. No significant GU symptoms. SKIN/HEME: ,no acute skin rashes suspicious lesions or bleeding. No lymphadenopathy, nodules, masses.  NEURO/ PSYCH:  No neurologic signs such as weakness numbness. No depression anxiety. IMM/ Allergy: No unusual infections.  Allergy .   REST of 12 system review negative except as per HPI   Past Medical History:  Diagnosis Date  . Atrial fibrillation ([place]) 04/28/2011   lone episode following shoulder surgery - normal echo  . BRCA gene positive    s/p bilateral mastectomy and hysterectomy.  . Hyperglycemia   . Hyperlipidemia   . Obesity   . Palpitations   . Thyroid disease 12/2012   9 mm left thyroid nodule w/microcalcifications--Dr. Regis Bill followinglas [place] was 10 15     Past Surgical  History:  Procedure Laterality Date  . ABDOMINAL HYSTERECTOMY  fall 2009   R-TLH/BSO  . BREAST SURGERY  10/2009   bilateral prophylactic mastectomy  . CERVICAL POLYPECTOMY  03-25-08   -benign  . CESAREAN SECTION     x 3  . MASTECTOMY     bilateral  . ROTATOR CUFF REPAIR Left     Family History  Problem Relation Age of Onset  . Breast cancer Sister 60       dec age 74  . Breast cancer Mother 90       dec age 35  . Lung cancer Father   . Breast cancer Maternal Grandmother   . Cancer - Other Brother        merkel cell   . Cancer Brother 100       Lung CA--Mesothelioma    Social History   Social History  . Marital status: Widowed    Spouse name: [name]  . Number of children: [name]  . Years of education: [name]   Occupational History  . works at [employer]    Social History Main Topics  . Smoking status: Never Smoker  . Smokeless tobacco: Never Used  . Alcohol use No  . Drug use: No  . Sexual activity: No  Comment: R-TLH/BSO   Other Topics Concern  . None   Social History Narrative   Widowed October 2009   Regular Exercise-yes   Plans on bm txp donor to her sis in the spring at [place]   Husband died from septicemia from bowel?   Works at [employer]   Has now sold her house and living with her son.  They buy a condo is trying to eat healthier   Sleep 6-7 hours    Pet dog           Outpatient Medications Prior to Visit  Medication Sig Dispense Refill  . Ascorbic Acid (VITAMIN C PO) Take by mouth.    Marland Kitchen aspirin 81 MG tablet Take 81 mg by mouth daily.    . Calcium Carbonate-Vitamin D (CALTRATE 600+D) 600-400 MG-UNIT per tablet Take 1 tablet by mouth daily.    . Cholecalciferol (VITAMIN D3) 2000 UNITS TABS Take by mouth. Taking 1 in the morning and 1 at night    . etodolac (LODINE) 400 MG tablet Take 400 mg by mouth 2 (two) times daily.   2  . Multiple Vitamins-Minerals (MULTIVITAMIN PO) Take by mouth.    . Probiotic Product (ALIGN) 4 MG  CAPS Take 1 tablet by mouth daily.     No facility-administered medications prior to visit.      EXAM:  BP 128/80 (BP Location: Left Arm, Patient Position: Sitting, Cuff Size: Normal)   Pulse 73   Temp 97.9 F (36.6 C) (Oral)   Ht 5' 6.5" (1.689 m)   Wt 215 lb 3.2 oz (97.6 kg)   LMP 05/07/2008   SpO2 98%   BMI 34.21 kg/m   Body mass index is 34.21 kg/m. Wt Readings from Last 3 Encounters:  04/04/17 215 lb 3.2 oz (97.6 kg)  09/02/16 216 lb (98 kg)  08/27/16 216 lb 3.2 oz (98.1 kg)    Physical Exam: Vital signs reviewed [id number] is a well-developed well-nourished alert cooperative    who appearsr stated age in no acute distress.  HEENT: normocephalic atraumatic , Eyes: PERRL EOM's full, conjunctiva clear, Nares: paten,t no deformity discharge or tenderness., Ears: no deformity EAC's clear TMs with normal landmarks. Mouth: clear OP, no lesions, edema.  Moist mucous membranes. Dentition in adequate repair. NECK: supple without masses, thyromegaly palpable no nodules felt  no bruits. CHEST/PULM:  Clear to auscultation and percussion breath sounds equal no wheeze , rales or rhonchi. No chest wall deformities or tenderness. Axilla clear  . CV: PMI is nondisplaced, S1 S2 no gallops, murmurs, rubs. Peripheral pulses are full without delay.No JVD .  br absent  ABDOMEN: Bowel sounds normal nontender  No guard or rebound, no hepato splenomegal no CVA tenderness.   Extremtities:  No clubbing cyanosis or edema, no acute joint swelling or redness no focal atrophy NEURO:  Oriented x3, cranial nerves 3-12 appear to be intact, no obvious focal weakness,gait within normal limits no abnormal reflexes or asymmetrical SKIN: No acute rashes normal turgor, color, no bruising or petechiae. PSYCH: Oriented, good eye contact, no obvious depression anxiety, cognition and judgment appear normal. LN: no cervical axillary inguinal adenopathy    BP Readings from Last 3 Encounters:  04/04/17 128/80    09/02/16 128/76  08/27/16 134/78    Wt Readings from Last 3 Encounters:  04/04/17 215 lb 3.2 oz (97.6 kg)  09/02/16 216 lb (98 kg)  08/27/16 216 lb 3.2 oz (98.1 kg)     ASSESSMENT AND PLAN:  Discussed the following assessment and plan:  Visit for preventive health examination - Plan: Basic metabolic panel, CBC with Differential/Platelet, Hemoglobin A1c, Hepatic function panel, Lipid panel, TSH  Hyperlipidemia, unspecified hyperlipidemia type - Plan: Basic metabolic panel, CBC with Differential/Platelet, Hemoglobin A1c, Hepatic function panel, Lipid panel, TSH  BRCA gene positive  Hyperglycemia - Plan: Basic metabolic panel, CBC with Differential/Platelet, Hemoglobin A1c, Hepatic function panel, Lipid panel, TSH  Abnormal thyroid ultrasound - stable  thyroid nodules - Plan: Basic metabolic panel, CBC with Differential/Platelet, Hemoglobin A1c, Hepatic function panel, Lipid panel, TSH  Vitamin D deficiency - on 2000 iu per day  - Plan: VITAMIN D 25 Hydroxy (Vit-D Deficiency, Fractures) dsic mmr  If needed rx given   Uncertain if immune  No cI if wants to  Get booster  Yearly check up  For next year  Or as needed  Will get [place] copy of her immuniz Updated  Patient Care Team: Henritta Mutz, Standley Brooking, MD as PCP - Farrel Conners, MD (Orthopedic Surgery) Juanita Craver, MD as Attending Physician (Gastroenterology) Nunzio Cobbs, MD as Consulting Physician (Obstetrics and Gynecology) Patient Instructions  . Continue lifestyle intervention healthy eating and exercise . OK to get mmr booster.  If all ok then yearly check up .      Preventive Care 40-64 Years, Female Preventive care refers to lifestyle choices and visits with your health care provider that can promote health and wellness. What does preventive care include?  A yearly physical exam. This is also called an annual well check.  Dental exams once or twice a year.  Routine eye exams. Ask your health care  provider how often you should have your eyes checked.  Personal lifestyle choices, including: ? Daily care of your teeth and gums. ? Regular physical activity. ? Eating a healthy diet. ? Avoiding tobacco and drug use. ? Limiting alcohol use. ? Practicing safe sex. ? Taking low-dose aspirin daily starting at age 77. ? Taking vitamin and mineral supplements as recommended by your health care provider. What happens during an annual well check? The services and screenings done by your health care provider during your annual well check will depend on your age, overall health, lifestyle risk factors, and family history of disease. Counseling Your health care provider may ask you questions about your:  Alcohol use.  Tobacco use.  Drug use.  Emotional well-being.  Home and relationship well-being.  Sexual activity.  Eating habits.  Work and work Statistician.  Method of birth control.  Menstrual cycle.  Pregnancy history.  Screening You may have the following tests or measurements:  Height, weight, and BMI.  Blood pressure.  Lipid and cholesterol levels. These may be checked every 5 years, or more frequently if you are over 7 years old.  Skin check.  Lung cancer screening. You may have this screening every year starting at age 70 if you have a 30-pack-year history of smoking and currently smoke or have quit within the past 15 years.  Fecal occult blood test (FOBT) of the stool. You may have this test every year starting at age 16.  Flexible sigmoidoscopy or colonoscopy. You may have a sigmoidoscopy every 5 years or a colonoscopy every 10 years starting at age 94.  Hepatitis C blood test.  Hepatitis B blood test.  Sexually transmitted disease (STD) testing.  Diabetes screening. This is done by checking your blood sugar (glucose) after you have not eaten for a while (fasting). You may have this done  every 1-3 years.  Mammogram. This may be done every 1-2 years.  Talk to your health care provider about when you should start having regular mammograms. This may depend on whether you have a family history of breast cancer.  BRCA-related cancer screening. This may be done if you have a family history of breast, ovarian, tubal, or peritoneal cancers.  Pelvic exam and Pap test. This may be done every 3 years starting at age 18. Starting at age 63, this may be done every 5 years if you have a Pap test in combination with an HPV test.  Bone density scan. This is done to screen for osteoporosis. You may have this scan if you are at high risk for osteoporosis.  Discuss your test results, treatment options, and if necessary, the need for more tests with your health care provider. Vaccines Your health care provider may recommend certain vaccines, such as:  Influenza vaccine. This is recommended every year.  Tetanus, diphtheria, and acellular pertussis (Tdap, Td) vaccine. You may need a Td booster every 10 years.  Varicella vaccine. You may need this if you have not been vaccinated.  Zoster vaccine. You may need this after age 74.  Measles, mumps, and rubella (MMR) vaccine. You may need at least one dose of MMR if you were born in 1957 or later. You may also need a second dose.  Pneumococcal 13-valent conjugate (PCV13) vaccine. You may need this if you have certain conditions and were not previously vaccinated.  Pneumococcal polysaccharide (PPSV23) vaccine. You may need one or two doses if you smoke cigarettes or if you have certain conditions.  Meningococcal vaccine. You may need this if you have certain conditions.  Hepatitis A vaccine. You may need this if you have certain conditions or if you travel or work in places where you may be exposed to hepatitis A.  Hepatitis B vaccine. You may need this if you have certain conditions or if you travel or work in places where you may be exposed to hepatitis B.  Haemophilus influenzae type b (Hib) vaccine. You  may need this if you have certain conditions.  Talk to your health care provider about which screenings and vaccines you need and how often you need them. This information is not intended to replace advice given to you by your health care provider. Make sure you discuss any questions you have with your health care provider. Document Released: 08/22/2015 Document Revised: 04/14/2016 Document Reviewed: 05/27/2015 Elsevier Interactive Patient Education  2017 [place] K. Chieko Neises M.D.

## 2017-04-04 ENCOUNTER — Ambulatory Visit (INDEPENDENT_AMBULATORY_CARE_PROVIDER_SITE_OTHER): Payer: BLUE CROSS/BLUE SHIELD | Admitting: Internal Medicine

## 2017-04-04 ENCOUNTER — Encounter: Payer: Self-pay | Admitting: Internal Medicine

## 2017-04-04 VITALS — BP 128/80 | HR 73 | Temp 97.9°F | Ht 66.5 in | Wt 215.2 lb

## 2017-04-04 DIAGNOSIS — R9389 Abnormal findings on diagnostic imaging of other specified body structures: Secondary | ICD-10-CM

## 2017-04-04 DIAGNOSIS — E785 Hyperlipidemia, unspecified: Secondary | ICD-10-CM | POA: Diagnosis not present

## 2017-04-04 DIAGNOSIS — R938 Abnormal findings on diagnostic imaging of other specified body structures: Secondary | ICD-10-CM

## 2017-04-04 DIAGNOSIS — E559 Vitamin D deficiency, unspecified: Secondary | ICD-10-CM | POA: Diagnosis not present

## 2017-04-04 DIAGNOSIS — Z0000 Encounter for general adult medical examination without abnormal findings: Secondary | ICD-10-CM

## 2017-04-04 DIAGNOSIS — Z1501 Genetic susceptibility to malignant neoplasm of breast: Secondary | ICD-10-CM

## 2017-04-04 DIAGNOSIS — Z1509 Genetic susceptibility to other malignant neoplasm: Secondary | ICD-10-CM

## 2017-04-04 DIAGNOSIS — R739 Hyperglycemia, unspecified: Secondary | ICD-10-CM

## 2017-04-04 LAB — CBC WITH DIFFERENTIAL/PLATELET
BASOS ABS: 0 10*3/uL (ref 0.0–0.1)
Basophils Relative: 0.8 % (ref 0.0–3.0)
EOS ABS: 0.1 10*3/uL (ref 0.0–0.7)
Eosinophils Relative: 2.2 % (ref 0.0–5.0)
HEMATOCRIT: 38.5 % (ref 36.0–46.0)
HEMOGLOBIN: 12.7 g/dL (ref 12.0–15.0)
LYMPHS PCT: 28.1 % (ref 12.0–46.0)
Lymphs Abs: 1.5 10*3/uL (ref 0.7–4.0)
MCHC: 32.9 g/dL (ref 30.0–36.0)
MCV: 89.4 fl (ref 78.0–100.0)
MONOS PCT: 8 % (ref 3.0–12.0)
Monocytes Absolute: 0.4 10*3/uL (ref 0.1–1.0)
Neutro Abs: 3.2 10*3/uL (ref 1.4–7.7)
Neutrophils Relative %: 60.9 % (ref 43.0–77.0)
Platelets: 187 10*3/uL (ref 150.0–400.0)
RBC: 4.31 Mil/uL (ref 3.87–5.11)
RDW: 14.3 % (ref 11.5–15.5)
WBC: 5.3 10*3/uL (ref 4.0–10.5)

## 2017-04-04 LAB — LIPID PANEL
CHOL/HDL RATIO: 4
Cholesterol: 221 mg/dL — ABNORMAL HIGH (ref 0–200)
HDL: 49.5 mg/dL (ref >39.00)
LDL CALC: 156 mg/dL — AB (ref 0–99)
NONHDL: 171.29
TRIGLYCERIDES: 76 mg/dL (ref 0.0–149.0)
VLDL: 15.2 mg/dL (ref 0.0–40.0)

## 2017-04-04 LAB — BASIC METABOLIC PANEL
BUN: 18 mg/dL (ref 6–23)
CALCIUM: 9.4 mg/dL (ref 8.4–10.5)
CO2: 32 mEq/L (ref 19–32)
CREATININE: 0.64 mg/dL (ref 0.40–1.20)
Chloride: 104 mEq/L (ref 96–112)
GFR: 100.46 mL/min (ref >60.00)
Glucose, Bld: 104 mg/dL — ABNORMAL HIGH (ref 70–99)
Potassium: 5 mEq/L (ref 3.5–5.1)
SODIUM: 138 meq/L (ref 135–145)

## 2017-04-04 LAB — HEMOGLOBIN A1C: Hgb A1c MFr Bld: 5.6 % (ref 4.6–6.5)

## 2017-04-04 LAB — HEPATIC FUNCTION PANEL
ALK PHOS: 86 U/L (ref 39–117)
ALT: 17 U/L (ref 0–35)
AST: 17 U/L (ref 0–37)
Albumin: 3.9 g/dL (ref 3.5–5.2)
BILIRUBIN DIRECT: 0.1 mg/dL (ref 0.0–0.3)
BILIRUBIN TOTAL: 0.4 mg/dL (ref 0.2–1.2)
Total Protein: 6.7 g/dL (ref 6.0–8.3)

## 2017-04-04 LAB — VITAMIN D 25 HYDROXY (VIT D DEFICIENCY, FRACTURES): VITD: 36.32 ng/mL (ref 30.00–100.00)

## 2017-04-04 LAB — TSH: TSH: 0.58 u[IU]/mL (ref 0.35–4.50)

## 2017-04-04 NOTE — Patient Instructions (Addendum)
. Continue lifestyle intervention healthy eating and exercise . OK to get mmr booster.  If all ok then yearly check up .      Preventive Care 40-64 Years, Female Preventive care refers to lifestyle choices and visits with your health care provider that can promote health and wellness. What does preventive care include?  A yearly physical exam. This is also called an annual well check.  Dental exams once or twice a year.  Routine eye exams. Ask your health care provider how often you should have your eyes checked.  Personal lifestyle choices, including: ? Daily care of your teeth and gums. ? Regular physical activity. ? Eating a healthy diet. ? Avoiding tobacco and drug use. ? Limiting alcohol use. ? Practicing safe sex. ? Taking low-dose aspirin daily starting at age 38. ? Taking vitamin and mineral supplements as recommended by your health care provider. What happens during an annual well check? The services and screenings done by your health care provider during your annual well check will depend on your age, overall health, lifestyle risk factors, and family history of disease. Counseling Your health care provider may ask you questions about your:  Alcohol use.  Tobacco use.  Drug use.  Emotional well-being.  Home and relationship well-being.  Sexual activity.  Eating habits.  Work and work Statistician.  Method of birth control.  Menstrual cycle.  Pregnancy history.  Screening You may have the following tests or measurements:  Height, weight, and BMI.  Blood pressure.  Lipid and cholesterol levels. These may be checked every 5 years, or more frequently if you are over 77 years old.  Skin check.  Lung cancer screening. You may have this screening every year starting at age 25 if you have a 30-pack-year history of smoking and currently smoke or have quit within the past 15 years.  Fecal occult blood test (FOBT) of the stool. You may have this test  every year starting at age 27.  Flexible sigmoidoscopy or colonoscopy. You may have a sigmoidoscopy every 5 years or a colonoscopy every 10 years starting at age 65.  Hepatitis C blood test.  Hepatitis B blood test.  Sexually transmitted disease (STD) testing.  Diabetes screening. This is done by checking your blood sugar (glucose) after you have not eaten for a while (fasting). You may have this done every 1-3 years.  Mammogram. This may be done every 1-2 years. Talk to your health care provider about when you should start having regular mammograms. This may depend on whether you have a family history of breast cancer.  BRCA-related cancer screening. This may be done if you have a family history of breast, ovarian, tubal, or peritoneal cancers.  Pelvic exam and Pap test. This may be done every 3 years starting at age 5. Starting at age 40, this may be done every 5 years if you have a Pap test in combination with an HPV test.  Bone density scan. This is done to screen for osteoporosis. You may have this scan if you are at high risk for osteoporosis.  Discuss your test results, treatment options, and if necessary, the need for more tests with your health care provider. Vaccines Your health care provider may recommend certain vaccines, such as:  Influenza vaccine. This is recommended every year.  Tetanus, diphtheria, and acellular pertussis (Tdap, Td) vaccine. You may need a Td booster every 10 years.  Varicella vaccine. You may need this if you have not been vaccinated.  Zoster vaccine.  You may need this after age 47.  Measles, mumps, and rubella (MMR) vaccine. You may need at least one dose of MMR if you were born in 1957 or later. You may also need a second dose.  Pneumococcal 13-valent conjugate (PCV13) vaccine. You may need this if you have certain conditions and were not previously vaccinated.  Pneumococcal polysaccharide (PPSV23) vaccine. You may need one or two doses if you  smoke cigarettes or if you have certain conditions.  Meningococcal vaccine. You may need this if you have certain conditions.  Hepatitis A vaccine. You may need this if you have certain conditions or if you travel or work in places where you may be exposed to hepatitis A.  Hepatitis B vaccine. You may need this if you have certain conditions or if you travel or work in places where you may be exposed to hepatitis B.  Haemophilus influenzae type b (Hib) vaccine. You may need this if you have certain conditions.  Talk to your health care provider about which screenings and vaccines you need and how often you need them. This information is not intended to replace advice given to you by your health care provider. Make sure you discuss any questions you have with your health care provider. Document Released: 08/22/2015 Document Revised: 04/14/2016 Document Reviewed: 05/27/2015 Elsevier Interactive Patient Education  2017 [employer].

## 2017-04-29 ENCOUNTER — Encounter: Payer: Self-pay | Admitting: Internal Medicine

## 2017-09-05 NOTE — Progress Notes (Signed)
62 y.o. G66P3002 Widowed Caucasian female here for annual exam.  Patient complains of some frequency of urination. No dysuria.  Had episode of pain and hematuria in April 2018.   No dx of renal stone. Wants urine checked due to concern about possible prior stone.   Small right vulvar lump noticed this am.   Easy bruising.  This is long standing.  Takes a baby ASA daily.   Another sister dx with breast cancer.   Daughter had twins in September.  Getting ready to retire.   Labs with PCPs.  Vaccines at [facility]. Had Shingles, MMR, Flu, TDap and Pneumococcus vaccines.  PCP:  Dr. Regis Bill   Patient's last menstrual period was 05/07/2008.           Sexually active: No.  The current method of family planning is status post hysterectomy.    Exercising: Yes.    walking and gym Smoker:  no  Health Maintenance: Pap:  2009 normal History of abnormal Pap:  no MMG:  12/2008 normal; prophylactic bilateral mastectomy 2011 Colonoscopy:  04/2013 polyps with Dr. Lenise Herald due 04/2018 BMD:   08/29/13  Result  Normal: [facility] TDaP:  Up to date with PCP Gardasil:   n/a HIV: donates blood  Hep C: donates blood Screening Labs: PCP   reports that  has never smoked. she has never used smokeless tobacco. She reports that she does not drink alcohol or use drugs.  Past Medical History:  Diagnosis Date  . Atrial fibrillation ([place]) 04/28/2011   lone episode following shoulder surgery - normal echo  . BRCA gene positive    s/p bilateral mastectomy and hysterectomy.  . Hyperglycemia   . Hyperlipidemia   . Obesity   . Palpitations   . Thyroid disease 12/2012   9 mm left thyroid nodule w/microcalcifications--Dr. Regis Bill followinglas [place] was 10 15     Past Surgical History:  Procedure Laterality Date  . ABDOMINAL HYSTERECTOMY  fall 2009   R-TLH/BSO  . BREAST SURGERY  10/2009   bilateral prophylactic mastectomy  . CERVICAL POLYPECTOMY  03-25-08   -benign  . CESAREAN SECTION     x 3  . MASTECTOMY      bilateral  . ROTATOR CUFF REPAIR Left     Current Outpatient Medications  Medication Sig Dispense Refill  . Ascorbic Acid (VITAMIN C PO) Take by mouth.    Marland Kitchen aspirin 81 MG tablet Take 81 mg by mouth daily.    . Calcium Carbonate-Vitamin D (CALTRATE 600+D) 600-400 MG-UNIT per tablet Take 1 tablet by mouth daily.    . Cholecalciferol (VITAMIN D3) 2000 UNITS TABS Take by mouth. Taking 1 in the morning and 1 at night    . Multiple Vitamins-Minerals (MULTIVITAMIN PO) Take by mouth.    . Probiotic Product (ALIGN) 4 MG CAPS Take 1 tablet by mouth daily.    Marland Kitchen etodolac (LODINE) 400 MG tablet Take 400 mg by mouth 2 (two) times daily.   2   No current facility-administered medications for this visit.     Family History  Problem Relation Age of Onset  . Breast cancer Sister 70       dec age 78  . Breast cancer Mother 48       dec age 70  . Lung cancer Father   . Breast cancer Sister 28  . Breast cancer Maternal Grandmother   . Cancer - Other Brother        merkel cell   . Cancer Brother 43  Lung CA--Mesothelioma    ROS:  Pertinent items are noted in HPI.  Otherwise, a comprehensive ROS was negative.  Exam:   BP 122/62 (BP Location: Right Arm, Patient Position: Sitting, Cuff Size: Large)   Pulse 68   Resp 16   Ht _0  (1.702 m)   Wt 212 lb (96.2 kg)   LMP 05/07/2008   BMI 33.20 kg/m     General appearance: alert, cooperative and appears stated age Head: Normocephalic, without obvious abnormality, atraumatic Neck: no adenopathy, supple, symmetrical, trachea midline and thyroid normal to inspection and palpation Lungs: clear to auscultation bilaterally Breasts:  absent Heart: regular rate and rhythm Abdomen: soft, non-tender; no masses, no organomegaly Extremities: extremities normal, atraumatic, no cyanosis or edema Skin: Skin color, texture, turgor normal. No rashes or lesions Lymph nodes: Cervical, supraclavicular, and axillary nodes normal. No abnormal inguinal nodes  palpated Neurologic: Grossly normal  Pelvic: External genitalia:  no lesions              Urethra:  normal appearing urethra with no masses, tenderness or lesions              Bartholins and Skenes: normal                 Vagina:  Left vaginal mid sidewall with 2 x 4 cm area of erythema.  Pap taken here.               Cervix:  Absent.              Pap taken: Yes.   Bimanual Exam:  Uterus:   absent              Adnexa: no mass, fullness, tenderness              Rectal exam: Yes.  .  Confirms.              Anus:  normal sphincter tone, no lesions  Chaperone was present for exam.  Assessment:   Well woman visit. Status post robotic TLH/BSO. Status post prophylactic bilateral mastectomy.  BRCA positive. Positive FH of breast cancer.  Hx atrial fibrillation.  Vaginal erythematous patch.   Plan:  Pap and HR HPV as above. Guidelines for Calcium, Vitamin D, regular exercise program including cardiovascular and weight bearing exercise. Labs with PCP.  We discussed benefits of BRCA testing for family members. Follow up annually and prn.   After visit summary provided.

## 2017-09-07 ENCOUNTER — Ambulatory Visit (INDEPENDENT_AMBULATORY_CARE_PROVIDER_SITE_OTHER): Payer: BLUE CROSS/BLUE SHIELD | Admitting: Obstetrics and Gynecology

## 2017-09-07 ENCOUNTER — Encounter: Payer: Self-pay | Admitting: Obstetrics and Gynecology

## 2017-09-07 ENCOUNTER — Other Ambulatory Visit: Payer: Self-pay

## 2017-09-07 ENCOUNTER — Other Ambulatory Visit (HOSPITAL_COMMUNITY)
Admission: RE | Admit: 2017-09-07 | Discharge: 2017-09-07 | Disposition: A | Discharge status: Home / Self Care | Payer: BLUE CROSS/BLUE SHIELD | Source: Ambulatory Visit | Attending: Obstetrics and Gynecology | Admitting: Obstetrics and Gynecology

## 2017-09-07 VITALS — BP 122/62 | HR 68 | Resp 16 | Ht 67.0 in | Wt 212.0 lb

## 2017-09-07 DIAGNOSIS — R350 Frequency of micturition: Secondary | ICD-10-CM | POA: Diagnosis not present

## 2017-09-07 DIAGNOSIS — Z01419 Encounter for gynecological examination (general) (routine) without abnormal findings: Secondary | ICD-10-CM

## 2017-09-07 LAB — POCT URINALYSIS DIPSTICK
Appearance: NORMAL
BILIRUBIN UA: NEGATIVE
GLUCOSE UA: NEGATIVE
Ketones, UA: NEGATIVE
Nitrite, UA: NEGATIVE
Protein, UA: NEGATIVE
RBC UA: NEGATIVE
Urobilinogen, UA: 0.2 E.U./dL
pH, UA: 5 (ref 5.0–8.0)

## 2017-09-07 NOTE — Patient Instructions (Signed)

## 2017-09-14 LAB — CYTOLOGY - PAP
DIAGNOSIS: NEGATIVE
HPV (WINDOPATH): NOT DETECTED

## 2018-03-13 LAB — HM COLONOSCOPY

## 2018-03-28 ENCOUNTER — Encounter: Payer: Self-pay | Admitting: Internal Medicine

## 2018-03-30 ENCOUNTER — Encounter: Payer: Self-pay | Admitting: Internal Medicine

## 2018-09-08 ENCOUNTER — Ambulatory Visit: Payer: BLUE CROSS/BLUE SHIELD | Admitting: Obstetrics and Gynecology
